# Patient Record
Sex: Female | Born: 1960 | Race: White | Hispanic: No | Marital: Married | Smoking: Never smoker
Health system: Southern US, Community
[De-identification: ages and names within clinical notes are randomized; demographics above are authoritative.]

## PROBLEM LIST (undated history)

## (undated) DIAGNOSIS — E039 Hypothyroidism, unspecified: Secondary | ICD-10-CM

## (undated) DIAGNOSIS — F419 Anxiety disorder, unspecified: Secondary | ICD-10-CM

## (undated) DIAGNOSIS — E785 Hyperlipidemia, unspecified: Secondary | ICD-10-CM

## (undated) DIAGNOSIS — D494 Neoplasm of unspecified behavior of bladder: Secondary | ICD-10-CM

## (undated) DIAGNOSIS — K219 Gastro-esophageal reflux disease without esophagitis: Secondary | ICD-10-CM

## (undated) DIAGNOSIS — I10 Essential (primary) hypertension: Secondary | ICD-10-CM

## (undated) DIAGNOSIS — C801 Malignant (primary) neoplasm, unspecified: Secondary | ICD-10-CM

## (undated) DIAGNOSIS — G43909 Migraine, unspecified, not intractable, without status migrainosus: Secondary | ICD-10-CM

## (undated) DIAGNOSIS — R51 Headache: Secondary | ICD-10-CM

## (undated) HISTORY — PX: TOTAL THYROIDECTOMY: SHX2547

## (undated) HISTORY — PX: COLONOSCOPY: SHX174

## (undated) HISTORY — PX: WISDOM TOOTH EXTRACTION: SHX21

---

## 1998-06-13 ENCOUNTER — Other Ambulatory Visit: Admission: RE | Admit: 1998-06-13 | Discharge: 1998-06-13 | Payer: Self-pay | Admitting: Obstetrics & Gynecology

## 1999-06-25 ENCOUNTER — Other Ambulatory Visit: Admission: RE | Admit: 1999-06-25 | Discharge: 1999-06-25 | Payer: Self-pay | Admitting: Obstetrics and Gynecology

## 2000-03-23 ENCOUNTER — Other Ambulatory Visit: Admission: RE | Admit: 2000-03-23 | Discharge: 2000-03-23 | Payer: Self-pay | Admitting: Obstetrics and Gynecology

## 2000-04-20 ENCOUNTER — Encounter: Payer: Self-pay | Admitting: Obstetrics and Gynecology

## 2000-04-20 ENCOUNTER — Ambulatory Visit (HOSPITAL_COMMUNITY): Admission: RE | Admit: 2000-04-20 | Discharge: 2000-04-20 | Payer: Self-pay | Admitting: Obstetrics and Gynecology

## 2001-04-20 ENCOUNTER — Other Ambulatory Visit: Admission: RE | Admit: 2001-04-20 | Discharge: 2001-04-20 | Payer: Self-pay | Admitting: Obstetrics and Gynecology

## 2003-03-15 ENCOUNTER — Encounter: Payer: Self-pay | Admitting: Obstetrics and Gynecology

## 2003-03-15 ENCOUNTER — Encounter: Admission: RE | Admit: 2003-03-15 | Discharge: 2003-03-15 | Payer: Self-pay | Admitting: Obstetrics and Gynecology

## 2003-05-25 ENCOUNTER — Encounter: Payer: Self-pay | Admitting: Obstetrics and Gynecology

## 2003-05-25 ENCOUNTER — Ambulatory Visit (HOSPITAL_COMMUNITY): Admission: RE | Admit: 2003-05-25 | Discharge: 2003-05-25 | Payer: Self-pay | Admitting: Obstetrics and Gynecology

## 2003-06-06 ENCOUNTER — Encounter (INDEPENDENT_AMBULATORY_CARE_PROVIDER_SITE_OTHER): Payer: Self-pay

## 2003-06-06 ENCOUNTER — Ambulatory Visit (HOSPITAL_COMMUNITY): Admission: RE | Admit: 2003-06-06 | Discharge: 2003-06-06 | Payer: Self-pay | Admitting: Obstetrics and Gynecology

## 2003-06-06 ENCOUNTER — Encounter: Payer: Self-pay | Admitting: Obstetrics and Gynecology

## 2003-06-30 ENCOUNTER — Encounter: Payer: Self-pay | Admitting: Surgery

## 2003-07-06 ENCOUNTER — Encounter (INDEPENDENT_AMBULATORY_CARE_PROVIDER_SITE_OTHER): Payer: Self-pay | Admitting: Specialist

## 2003-07-06 ENCOUNTER — Observation Stay (HOSPITAL_COMMUNITY): Admission: RE | Admit: 2003-07-06 | Discharge: 2003-07-07 | Payer: Self-pay | Admitting: Surgery

## 2003-08-21 ENCOUNTER — Encounter (HOSPITAL_COMMUNITY): Admission: RE | Admit: 2003-08-21 | Discharge: 2003-11-19 | Payer: Self-pay | Admitting: Surgery

## 2003-09-01 ENCOUNTER — Encounter: Payer: Self-pay | Admitting: Surgery

## 2004-04-24 ENCOUNTER — Encounter: Admission: RE | Admit: 2004-04-24 | Discharge: 2004-04-24 | Payer: Self-pay | Admitting: Endocrinology

## 2004-09-23 ENCOUNTER — Encounter (HOSPITAL_COMMUNITY): Admission: RE | Admit: 2004-09-23 | Discharge: 2004-12-22 | Payer: Self-pay | Admitting: Endocrinology

## 2004-10-11 ENCOUNTER — Encounter: Admission: RE | Admit: 2004-10-11 | Discharge: 2004-10-11 | Payer: Self-pay | Admitting: Obstetrics and Gynecology

## 2005-09-29 ENCOUNTER — Encounter (HOSPITAL_COMMUNITY): Admission: RE | Admit: 2005-09-29 | Discharge: 2005-12-28 | Payer: Self-pay | Admitting: Endocrinology

## 2005-10-24 ENCOUNTER — Encounter: Admission: RE | Admit: 2005-10-24 | Discharge: 2005-10-24 | Payer: Self-pay | Admitting: Obstetrics and Gynecology

## 2006-03-09 ENCOUNTER — Encounter: Admission: RE | Admit: 2006-03-09 | Discharge: 2006-03-09 | Payer: Self-pay | Admitting: Endocrinology

## 2006-10-12 ENCOUNTER — Encounter (HOSPITAL_COMMUNITY): Admission: RE | Admit: 2006-10-12 | Discharge: 2006-10-16 | Payer: Self-pay | Admitting: Endocrinology

## 2006-11-25 ENCOUNTER — Encounter: Admission: RE | Admit: 2006-11-25 | Discharge: 2006-11-25 | Payer: Self-pay | Admitting: Obstetrics and Gynecology

## 2007-12-06 ENCOUNTER — Encounter: Admission: RE | Admit: 2007-12-06 | Discharge: 2007-12-06 | Payer: Self-pay | Admitting: Obstetrics and Gynecology

## 2008-03-31 ENCOUNTER — Encounter: Admission: RE | Admit: 2008-03-31 | Discharge: 2008-03-31 | Payer: Self-pay | Admitting: Endocrinology

## 2008-07-18 ENCOUNTER — Ambulatory Visit (HOSPITAL_COMMUNITY): Admission: RE | Admit: 2008-07-18 | Discharge: 2008-07-18 | Payer: Self-pay | Admitting: Internal Medicine

## 2008-07-24 ENCOUNTER — Encounter (HOSPITAL_COMMUNITY): Admission: RE | Admit: 2008-07-24 | Discharge: 2008-10-04 | Payer: Self-pay | Admitting: Internal Medicine

## 2009-01-12 ENCOUNTER — Encounter: Admission: RE | Admit: 2009-01-12 | Discharge: 2009-01-12 | Payer: Self-pay | Admitting: Obstetrics and Gynecology

## 2009-12-10 ENCOUNTER — Encounter: Admission: RE | Admit: 2009-12-10 | Discharge: 2009-12-10 | Payer: Self-pay | Admitting: Internal Medicine

## 2010-02-14 ENCOUNTER — Encounter: Admission: RE | Admit: 2010-02-14 | Discharge: 2010-02-14 | Payer: Self-pay | Admitting: Obstetrics and Gynecology

## 2010-12-22 ENCOUNTER — Encounter: Payer: Self-pay | Admitting: Endocrinology

## 2010-12-22 ENCOUNTER — Encounter: Payer: Self-pay | Admitting: Obstetrics and Gynecology

## 2010-12-23 ENCOUNTER — Encounter: Payer: Self-pay | Admitting: Internal Medicine

## 2011-01-14 ENCOUNTER — Other Ambulatory Visit: Payer: Self-pay | Admitting: Internal Medicine

## 2011-01-14 DIAGNOSIS — C73 Malignant neoplasm of thyroid gland: Secondary | ICD-10-CM

## 2011-01-24 ENCOUNTER — Ambulatory Visit
Admission: RE | Admit: 2011-01-24 | Discharge: 2011-01-24 | Disposition: A | Discharge status: Home / Self Care | Payer: BC Managed Care – PPO | Source: Ambulatory Visit | Attending: Internal Medicine | Admitting: Internal Medicine

## 2011-01-24 DIAGNOSIS — C73 Malignant neoplasm of thyroid gland: Secondary | ICD-10-CM

## 2011-03-24 ENCOUNTER — Other Ambulatory Visit: Payer: Self-pay | Admitting: Obstetrics and Gynecology

## 2011-03-24 DIAGNOSIS — Z1231 Encounter for screening mammogram for malignant neoplasm of breast: Secondary | ICD-10-CM

## 2011-04-02 ENCOUNTER — Ambulatory Visit
Admission: RE | Admit: 2011-04-02 | Discharge: 2011-04-02 | Disposition: A | Discharge status: Home / Self Care | Payer: BC Managed Care – PPO | Source: Ambulatory Visit | Attending: Obstetrics and Gynecology | Admitting: Obstetrics and Gynecology

## 2011-04-02 DIAGNOSIS — Z1231 Encounter for screening mammogram for malignant neoplasm of breast: Secondary | ICD-10-CM

## 2011-04-04 ENCOUNTER — Other Ambulatory Visit: Payer: Self-pay | Admitting: Obstetrics and Gynecology

## 2011-04-04 DIAGNOSIS — N644 Mastodynia: Secondary | ICD-10-CM

## 2011-04-18 NOTE — Op Note (Signed)
NAME:  Carla Rojas, Carla Rojas                          ACCOUNT NO.:  [id number]   MEDICAL RECORD NO.:  [id number]                   PATIENT TYPE:  AMB   LOCATION:  DAY                                  FACILITY:  [facility]   PHYSICIAN:  Currie Paris, M.D.           DATE OF BIRTH:  1961-03-28   DATE OF PROCEDURE:  07/06/2003  DATE OF DISCHARGE:                                 OPERATIVE REPORT   Office MR# [id number].   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   POSTOPERATIVE DIAGNOSIS:  Papillary carcinoma, left thyroid lobe.   OPERATION/PROCEDURE:  Total thyroidectomy.   SURGEON:  Currie Paris, M.D.   ASSISTANT:  Abigail Miyamoto, M.D.   ANESTHESIA:  General endotracheal anesthesia.   CLINICAL HISTORY:  This patient is a 50 year old who was noted to have a  left thyroid nodule on physical examination and a scan showed some  calcification and an FNA showed a papillary carcinoma.  She had a TSH  showing her to be euthyroid.  After a lengthy discussion with the patient,  she elected to proceed to left thyroidectomy with probable total  thyroidectomy as there is also a smaller nodule in the right lobe.   DESCRIPTION OF PROCEDURE:  The patient was seen in the holding area, had no  further questions and was ready to proceed to thyroidectomy.   She was taken to the operating room and after satisfactory general  endotracheal anesthesia obtained, the neck was prepped and draped.  We made  a curving incision in a skin crease to divide the skin and subcutaneous  tissue and platysma.  Then elevated the subplatysmal flaps in the usual  fashion.  Self-retaining retractor was placed.  The pretracheal fascia was  opened in the midline.  The muscles were elevated off the left lobe of the  thyroid to expose it.  I divided one small vein coming to the inferior pole  because that was tethering a little bit and then divided the middle thyroid  vein after  sucking that out, identifying it coming into the thyroid.  I then  mobilized the superior pole using some traction and there was a little bit  of muscle stuck here but we were able to nicely dissect that off.  I ligated  the superior pole vessels, dissecting them out and individually ligating  them.   Thyroid then rotated medially.  I was able to dissect into the area of the  TE groove and identify nerve.  I also saw what appeared to be the superior  and anterior parathyroids laying on the undersurface of the thyroid gland  and fed by small vessels coming from the inferior thyroid artery.  The  vessels coming under the thyroid were divided individually using clips.  They stayed well upon the thyroid trying to stay in a nice plane here and  away from the distal on the vessels, so they  were getting the branches and  then allowing the parathyroids and vasculature to retract back laterally  into the neck.  I came around working from the inferior pole up to the  superior pole and paying attention to the nerve, was able to dissect the  thyroid off and loosen it from the trachea, and then divide the attachments  throughout the ligament of Allyson Sabal again, noticing the nerve appeared intact  through this dissection.  I freed up a little more detachments medially of  the isthmus and then at this point had the entire left lobe and isthmus  freed up off the thyroid.  The isthmus was divided and the thyroid gland  sent for frozen section.  This subsequently returned papillary carcinoma of  the thyroid.   Plans had been made already to proceed to total thyroidectomy because of the  presence of the nodule in the right lobe and high suspicion on FNA that this  was a papillary tumor.  I approached the right lobe similar to the left  lobe.  The middle vein was divided first.  I then individually ligated the  vessels to the superior pole staying right as they entered the superior  pole.  Then with retraction  of the gland medially, again able to identify  the nerve as it entered the larynx.  Once that was identified, I freed up  the inferior pole some more and then again began developing and dividing  small branches of the artery coming out  of the thyroid, staying up on the  thyroid vein and doing this individually.  Again we saw what appeared to be  parathyroid gland although these could represent small lymph nodes.  I felt  by their location and by their vessels coming off, that they were  parathyroid glands.  Again we stayed right on the cervical plane of the  thyroid and finally ligated and divided the small attachments at the  ligament of Berry.  Both sides were irrigated and checked carefully for  hemostasis at the completion of each side and then at the completion of the  case.  Everything appeared to be dry.   The incision was closed using some 4-0 Vicryl to approximate the straps, 4-0  Vicryl to close the platysma and staples and Steri-Strips on the skin.   The patient tolerated the procedure well.  There were no operative  complications.  All counts were correct.                                               Currie Paris, M.D.    CJS/MEDQ  D:  07/06/2003  T:  07/06/2003  Job:  [id number]   cc:   Donia Guiles, M.D.  [address] [place]  [place] [address]  Fax: [phone]   Malachi Pro. Ambrose Mantle, M.D.  [address]  [address]  [place]  [place] [address]  Fax: [phone]

## 2011-05-06 ENCOUNTER — Ambulatory Visit
Admission: RE | Admit: 2011-05-06 | Discharge: 2011-05-06 | Disposition: A | Discharge status: Home / Self Care | Payer: BC Managed Care – PPO | Source: Ambulatory Visit | Attending: Obstetrics and Gynecology | Admitting: Obstetrics and Gynecology

## 2011-05-06 DIAGNOSIS — N644 Mastodynia: Secondary | ICD-10-CM

## 2012-01-12 ENCOUNTER — Other Ambulatory Visit: Payer: Self-pay | Admitting: Internal Medicine

## 2012-01-12 DIAGNOSIS — C73 Malignant neoplasm of thyroid gland: Secondary | ICD-10-CM

## 2012-01-21 ENCOUNTER — Ambulatory Visit
Admission: RE | Admit: 2012-01-21 | Discharge: 2012-01-21 | Disposition: A | Discharge status: Home / Self Care | Payer: BC Managed Care – PPO | Source: Ambulatory Visit | Attending: Internal Medicine | Admitting: Internal Medicine

## 2012-01-21 DIAGNOSIS — C73 Malignant neoplasm of thyroid gland: Secondary | ICD-10-CM

## 2012-06-21 ENCOUNTER — Other Ambulatory Visit: Payer: Self-pay | Admitting: Obstetrics and Gynecology

## 2012-06-21 DIAGNOSIS — Z1231 Encounter for screening mammogram for malignant neoplasm of breast: Secondary | ICD-10-CM

## 2012-07-06 ENCOUNTER — Ambulatory Visit: Payer: BC Managed Care – PPO

## 2012-07-27 ENCOUNTER — Ambulatory Visit
Admission: RE | Admit: 2012-07-27 | Discharge: 2012-07-27 | Disposition: A | Discharge status: Home / Self Care | Payer: BC Managed Care – PPO | Source: Ambulatory Visit | Attending: Obstetrics and Gynecology | Admitting: Obstetrics and Gynecology

## 2012-07-27 DIAGNOSIS — Z1231 Encounter for screening mammogram for malignant neoplasm of breast: Secondary | ICD-10-CM

## 2013-03-22 ENCOUNTER — Other Ambulatory Visit: Payer: Self-pay | Admitting: Internal Medicine

## 2013-03-22 DIAGNOSIS — C73 Malignant neoplasm of thyroid gland: Secondary | ICD-10-CM

## 2013-04-04 ENCOUNTER — Ambulatory Visit
Admission: RE | Admit: 2013-04-04 | Discharge: 2013-04-04 | Disposition: A | Discharge status: Home / Self Care | Payer: BC Managed Care – PPO | Source: Ambulatory Visit | Attending: Internal Medicine | Admitting: Internal Medicine

## 2013-04-04 DIAGNOSIS — C73 Malignant neoplasm of thyroid gland: Secondary | ICD-10-CM

## 2013-08-03 ENCOUNTER — Other Ambulatory Visit: Payer: Self-pay

## 2013-08-03 DIAGNOSIS — Z1231 Encounter for screening mammogram for malignant neoplasm of breast: Secondary | ICD-10-CM

## 2013-08-19 ENCOUNTER — Ambulatory Visit
Admission: RE | Admit: 2013-08-19 | Discharge: 2013-08-19 | Disposition: A | Discharge status: Home / Self Care | Payer: BC Managed Care – PPO | Source: Ambulatory Visit

## 2013-08-19 DIAGNOSIS — Z1231 Encounter for screening mammogram for malignant neoplasm of breast: Secondary | ICD-10-CM

## 2013-08-23 ENCOUNTER — Other Ambulatory Visit: Payer: Self-pay | Admitting: Obstetrics and Gynecology

## 2013-08-23 DIAGNOSIS — R928 Other abnormal and inconclusive findings on diagnostic imaging of breast: Secondary | ICD-10-CM

## 2013-08-26 ENCOUNTER — Ambulatory Visit
Admission: RE | Admit: 2013-08-26 | Discharge: 2013-08-26 | Disposition: A | Discharge status: Home / Self Care | Payer: BC Managed Care – PPO | Source: Ambulatory Visit | Attending: Obstetrics and Gynecology | Admitting: Obstetrics and Gynecology

## 2013-08-26 DIAGNOSIS — R928 Other abnormal and inconclusive findings on diagnostic imaging of breast: Secondary | ICD-10-CM

## 2014-09-04 ENCOUNTER — Other Ambulatory Visit: Payer: Self-pay | Admitting: Gastroenterology

## 2014-09-21 ENCOUNTER — Other Ambulatory Visit: Payer: Self-pay

## 2014-09-21 DIAGNOSIS — Z1231 Encounter for screening mammogram for malignant neoplasm of breast: Secondary | ICD-10-CM

## 2014-10-03 ENCOUNTER — Ambulatory Visit
Admission: RE | Admit: 2014-10-03 | Discharge: 2014-10-03 | Disposition: A | Discharge status: Home / Self Care | Payer: BC Managed Care – PPO | Source: Ambulatory Visit

## 2014-10-03 DIAGNOSIS — Z1231 Encounter for screening mammogram for malignant neoplasm of breast: Secondary | ICD-10-CM

## 2015-07-02 ENCOUNTER — Other Ambulatory Visit (HOSPITAL_COMMUNITY): Payer: Self-pay | Admitting: Otolaryngology

## 2015-07-02 DIAGNOSIS — R1310 Dysphagia, unspecified: Secondary | ICD-10-CM

## 2015-07-03 ENCOUNTER — Other Ambulatory Visit (HOSPITAL_COMMUNITY): Payer: Self-pay | Admitting: Otolaryngology

## 2015-07-03 DIAGNOSIS — R1314 Dysphagia, pharyngoesophageal phase: Secondary | ICD-10-CM

## 2015-07-04 ENCOUNTER — Ambulatory Visit (HOSPITAL_COMMUNITY): Admission: RE | Admit: 2015-07-04 | Payer: BC Managed Care – PPO | Source: Ambulatory Visit

## 2015-07-04 ENCOUNTER — Ambulatory Visit (HOSPITAL_COMMUNITY): Payer: BC Managed Care – PPO

## 2015-07-06 ENCOUNTER — Ambulatory Visit (HOSPITAL_COMMUNITY)
Admission: RE | Admit: 2015-07-06 | Discharge: 2015-07-06 | Disposition: A | Discharge status: Home / Self Care | Payer: BC Managed Care – PPO | Source: Ambulatory Visit | Attending: Otolaryngology | Admitting: Otolaryngology

## 2015-07-06 ENCOUNTER — Other Ambulatory Visit (HOSPITAL_COMMUNITY): Payer: Self-pay | Admitting: Otolaryngology

## 2015-07-06 DIAGNOSIS — R05 Cough: Secondary | ICD-10-CM | POA: Diagnosis not present

## 2015-07-06 DIAGNOSIS — E890 Postprocedural hypothyroidism: Secondary | ICD-10-CM | POA: Insufficient documentation

## 2015-07-06 DIAGNOSIS — Z85850 Personal history of malignant neoplasm of thyroid: Secondary | ICD-10-CM | POA: Insufficient documentation

## 2015-07-06 DIAGNOSIS — R1314 Dysphagia, pharyngoesophageal phase: Secondary | ICD-10-CM

## 2015-07-06 DIAGNOSIS — R1310 Dysphagia, unspecified: Secondary | ICD-10-CM

## 2015-07-06 DIAGNOSIS — R4702 Dysphasia: Secondary | ICD-10-CM

## 2015-07-06 DIAGNOSIS — K449 Diaphragmatic hernia without obstruction or gangrene: Secondary | ICD-10-CM | POA: Insufficient documentation

## 2015-07-06 DIAGNOSIS — K210 Gastro-esophageal reflux disease with esophagitis: Secondary | ICD-10-CM | POA: Insufficient documentation

## 2015-07-06 DIAGNOSIS — R066 Hiccough: Secondary | ICD-10-CM | POA: Diagnosis not present

## 2015-07-06 DIAGNOSIS — K224 Dyskinesia of esophagus: Secondary | ICD-10-CM | POA: Insufficient documentation

## 2015-08-02 ENCOUNTER — Ambulatory Visit: Payer: BC Managed Care – PPO | Admitting: Internal Medicine

## 2015-10-10 ENCOUNTER — Other Ambulatory Visit: Payer: Self-pay

## 2015-10-10 DIAGNOSIS — Z1231 Encounter for screening mammogram for malignant neoplasm of breast: Secondary | ICD-10-CM

## 2015-11-12 ENCOUNTER — Ambulatory Visit
Admission: RE | Admit: 2015-11-12 | Discharge: 2015-11-12 | Disposition: A | Discharge status: Home / Self Care | Payer: BC Managed Care – PPO | Source: Ambulatory Visit

## 2015-11-12 DIAGNOSIS — Z1231 Encounter for screening mammogram for malignant neoplasm of breast: Secondary | ICD-10-CM

## 2016-04-11 ENCOUNTER — Other Ambulatory Visit: Payer: Self-pay | Admitting: Internal Medicine

## 2016-04-11 DIAGNOSIS — C73 Malignant neoplasm of thyroid gland: Secondary | ICD-10-CM

## 2016-04-17 ENCOUNTER — Ambulatory Visit
Admission: RE | Admit: 2016-04-17 | Discharge: 2016-04-17 | Disposition: A | Discharge status: Home / Self Care | Payer: BC Managed Care – PPO | Source: Ambulatory Visit | Attending: Internal Medicine | Admitting: Internal Medicine

## 2016-04-17 DIAGNOSIS — C73 Malignant neoplasm of thyroid gland: Secondary | ICD-10-CM

## 2016-10-09 ENCOUNTER — Other Ambulatory Visit: Payer: Self-pay | Admitting: Family Medicine

## 2016-10-09 DIAGNOSIS — Z1231 Encounter for screening mammogram for malignant neoplasm of breast: Secondary | ICD-10-CM

## 2016-11-12 ENCOUNTER — Ambulatory Visit
Admission: RE | Admit: 2016-11-12 | Discharge: 2016-11-12 | Disposition: A | Discharge status: Home / Self Care | Payer: BC Managed Care – PPO | Source: Ambulatory Visit | Attending: Family Medicine | Admitting: Family Medicine

## 2016-11-12 DIAGNOSIS — Z1231 Encounter for screening mammogram for malignant neoplasm of breast: Secondary | ICD-10-CM

## 2017-10-05 ENCOUNTER — Other Ambulatory Visit: Payer: Self-pay | Admitting: Obstetrics and Gynecology

## 2017-10-07 ENCOUNTER — Other Ambulatory Visit: Payer: Self-pay | Admitting: Obstetrics and Gynecology

## 2017-10-07 DIAGNOSIS — Z1231 Encounter for screening mammogram for malignant neoplasm of breast: Secondary | ICD-10-CM

## 2017-10-23 NOTE — H&P (Signed)
Carla Rojas, Carla Rojas NO.:  [id number]  MEDICAL RECORD NO.:  [id number]  LOCATION:                                 FACILITY:  PHYSICIAN:  Lucille Passy. Ulanda Edison, M.D.      DATE OF BIRTH:  DATE OF ADMISSION: DATE OF DISCHARGE:                             HISTORY & PHYSICAL   PRESENT ILLNESS:  This is a 56 year old white female, para 0, who is admitted for D and C, hysteroscopy, and probable removal of polyp because of postmenopausal bleeding.  The patient was seen on September 07, 2017, and reported a spot of bleeding from her vagina in the past month. We chose to proceed with ultrasound.  The ultrasound showed an endometrial thickness of 4.6 mm that suggested an endometrial polyp 6 x 2 x 6 mm.  Patient was counseled about the presence of a presumed polyp, even though it was small, and we chose to proceed with doing a D and C and if the polyp is present, remove the polyp.  Since that time, the patient has had an additional spot of bleeding from her vagina.  PAST MEDICAL HISTORY:  Reveals no known allergies.  Operation:  She has had oral surgery x3, colonoscopy in 2015.  Medical history:  The patient is hypothyroid.  She also has high lipids. She has some depression.  FAMILY HISTORY:  Mother with good health.  Paternal grandfather with diabetes and heart disease.  Maternal grandfather with diabetes, heart disease, and high blood pressure.  The patient has had normal Pap smears.  She does not drink or smoke.  She has worked at [employer] for 35 years.  PHYSICAL EXAMINATION:  GENERAL:  Well-developed, somewhat overweight white female, in no distress.  5 feet 6 inches, 184 pounds, BMI 29.7. Blood pressure 124/82, pulse of 72. HEAD, EYES, NOSE, AND THROAT:  Normal.  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  Throat is normal. NECK:  Supple, without thyromegaly. HEART:  Normal size and sounds.  No murmurs. LUNGS:  Clear to auscultation. BREASTS:  Soft,  nontender.  No masses palpable. ABDOMEN:  Soft and nontender.  No masses. PELVIC:  Vulva and vagina clean, cervix clean, uterus normal size, adnexa clear.  ADMITTING IMPRESSION:  Postmenopausal bleeding, probable small endometrial polyp.  The patient is admitted to have D and C and if a polyp is present, remove the polyp.  She understands that there are some risks involved including the risk of anesthesia, risk of perforating the uterus, risk of fluid overload, injury to surrounding organs, she is ready to proceed.     Lucille Passy. Ulanda Edison, M.D.   ______________________________ Lucille Passy. Ulanda Edison, M.D.    TFH/MEDQ  D:  10/22/2017  T:  10/22/2017  Job:  [id number]

## 2017-10-26 NOTE — Patient Instructions (Addendum)
Your procedure is scheduled on:  FRIDAY, NOV 30  Enter through the [place] [facility] at:  6 AM  Pick up the phone at the desk and dial [phone].  Call this number if you have problems the morning of surgery: [phone].  Remember: Do NOT eat food OR drink clear liquids (including water) after Midnight Thursday.  Take these medicines the morning of surgery with a SIP OF WATER:  Atenolol, synthroid, liothyronine  Bring inhaler with you on day of surgery.  Do Not smoke on the day of surgery.  Stop herbal medications and supplements at this time.  Do NOT wear jewelry (body piercing), metal hair clips/bobby pins, make-up, or nail polish. Do NOT wear lotions, powders, or perfumes.  You may wear deoderant. Do NOT shave for 48 hours prior to surgery. Do NOT bring valuables to the hospital. Contacts, dentures, or bridgework may not be worn into surgery.  Leave suitcase in car.  After surgery it may be brought to your room.  For patients admitted to the hospital, checkout time is 11:00 AM the day of discharge. Have a responsible adult drive you home and stay with you for 24 hours after your procedure.

## 2017-10-28 ENCOUNTER — Encounter (HOSPITAL_COMMUNITY): Payer: Self-pay | Admitting: *Deleted

## 2017-10-28 ENCOUNTER — Encounter (HOSPITAL_COMMUNITY)
Admission: RE | Admit: 2017-10-28 | Discharge: 2017-10-28 | Disposition: A | Discharge status: Home / Self Care | Payer: BC Managed Care – PPO | Source: Ambulatory Visit | Attending: Obstetrics and Gynecology | Admitting: Obstetrics and Gynecology

## 2017-10-28 ENCOUNTER — Other Ambulatory Visit: Payer: Self-pay

## 2017-10-28 DIAGNOSIS — R001 Bradycardia, unspecified: Secondary | ICD-10-CM | POA: Diagnosis not present

## 2017-10-28 DIAGNOSIS — E039 Hypothyroidism, unspecified: Secondary | ICD-10-CM | POA: Diagnosis not present

## 2017-10-28 DIAGNOSIS — N950 Postmenopausal bleeding: Secondary | ICD-10-CM | POA: Diagnosis present

## 2017-10-28 DIAGNOSIS — E669 Obesity, unspecified: Secondary | ICD-10-CM | POA: Diagnosis not present

## 2017-10-28 DIAGNOSIS — F329 Major depressive disorder, single episode, unspecified: Secondary | ICD-10-CM | POA: Diagnosis not present

## 2017-10-28 DIAGNOSIS — I10 Essential (primary) hypertension: Secondary | ICD-10-CM | POA: Diagnosis not present

## 2017-10-28 DIAGNOSIS — Z6831 Body mass index (BMI) 31.0-31.9, adult: Secondary | ICD-10-CM | POA: Diagnosis not present

## 2017-10-28 DIAGNOSIS — Z79899 Other long term (current) drug therapy: Secondary | ICD-10-CM | POA: Diagnosis not present

## 2017-10-28 DIAGNOSIS — N840 Polyp of corpus uteri: Secondary | ICD-10-CM | POA: Diagnosis not present

## 2017-10-28 LAB — BASIC METABOLIC PANEL
Anion gap: 7 (ref 5–15)
BUN: 14 mg/dL (ref 6–20)
CALCIUM: 8.9 mg/dL (ref 8.9–10.3)
CO2: 29 mmol/L (ref 22–32)
CREATININE: 0.75 mg/dL (ref 0.44–1.00)
Chloride: 101 mmol/L (ref 101–111)
GFR calc Af Amer: >60 mL/min (ref >60)
GLUCOSE: 97 mg/dL (ref 65–99)
Potassium: 3.8 mmol/L (ref 3.5–5.1)
Sodium: 137 mmol/L (ref 135–145)

## 2017-10-28 LAB — CBC
HEMATOCRIT: 42.8 % (ref 36.0–46.0)
Hemoglobin: 13.9 g/dL (ref 12.0–15.0)
MCH: 29 pg (ref 26.0–34.0)
MCHC: 32.5 g/dL (ref 30.0–36.0)
MCV: 89.2 fL (ref 78.0–100.0)
PLATELETS: 281 10*3/uL (ref 150–400)
RBC: 4.8 MIL/uL (ref 3.87–5.11)
RDW: 13.1 % (ref 11.5–15.5)
WBC: 5.6 10*3/uL (ref 4.0–10.5)

## 2017-10-28 NOTE — Patient Instructions (Addendum)
Your procedure is scheduled on:  Friday, Nov 30  Enter through the Main Entrance of [facility] at: 6 am  Pick up the phone at the desk and dial [phone].  Call this number if you have problems the morning of surgery: [phone].  Remember: Do NOT eat food or drink clear liquids (including water) after Midnight Thursday  Take these medicines the morning of surgery with a SIP OF WATER: atenolol, synthroid, cytomel  Stop herbal medications and supplements at this time.  Do NOT wear jewelry (body piercing), metal hair clips/bobby pins, make-up, or nail polish. Do NOT wear lotions, powders, or perfumes.  You may wear deoderant. Do NOT shave for 48 hours prior to surgery. Do NOT bring valuables to the hospital.  Have a responsible adult drive you home and stay with you for 24 hours after your procedure.  Home with husband Carla Rojas cell [phone].

## 2017-10-30 ENCOUNTER — Ambulatory Visit (HOSPITAL_COMMUNITY)
Admission: AD | Admit: 2017-10-30 | Discharge: 2017-10-30 | Disposition: A | Discharge status: Home / Self Care | Payer: BC Managed Care – PPO | Source: Ambulatory Visit | Attending: Obstetrics and Gynecology | Admitting: Obstetrics and Gynecology

## 2017-10-30 ENCOUNTER — Encounter (HOSPITAL_COMMUNITY)
Admission: AD | Disposition: A | Discharge status: Home / Self Care | Payer: Self-pay | Source: Ambulatory Visit | Attending: Obstetrics and Gynecology

## 2017-10-30 ENCOUNTER — Encounter (HOSPITAL_COMMUNITY): Payer: Self-pay | Admitting: Emergency Medicine

## 2017-10-30 ENCOUNTER — Ambulatory Visit (HOSPITAL_COMMUNITY): Payer: BC Managed Care – PPO | Admitting: Certified Registered Nurse Anesthetist

## 2017-10-30 DIAGNOSIS — E669 Obesity, unspecified: Secondary | ICD-10-CM | POA: Insufficient documentation

## 2017-10-30 DIAGNOSIS — Z79899 Other long term (current) drug therapy: Secondary | ICD-10-CM | POA: Insufficient documentation

## 2017-10-30 DIAGNOSIS — N950 Postmenopausal bleeding: Secondary | ICD-10-CM | POA: Diagnosis not present

## 2017-10-30 DIAGNOSIS — Z6831 Body mass index (BMI) 31.0-31.9, adult: Secondary | ICD-10-CM | POA: Insufficient documentation

## 2017-10-30 DIAGNOSIS — N840 Polyp of corpus uteri: Secondary | ICD-10-CM | POA: Insufficient documentation

## 2017-10-30 DIAGNOSIS — I10 Essential (primary) hypertension: Secondary | ICD-10-CM | POA: Insufficient documentation

## 2017-10-30 DIAGNOSIS — R001 Bradycardia, unspecified: Secondary | ICD-10-CM | POA: Insufficient documentation

## 2017-10-30 DIAGNOSIS — E039 Hypothyroidism, unspecified: Secondary | ICD-10-CM | POA: Insufficient documentation

## 2017-10-30 DIAGNOSIS — F329 Major depressive disorder, single episode, unspecified: Secondary | ICD-10-CM | POA: Insufficient documentation

## 2017-10-30 HISTORY — DX: Headache: R51

## 2017-10-30 HISTORY — DX: Malignant (primary) neoplasm, unspecified: C80.1

## 2017-10-30 HISTORY — DX: Hypothyroidism, unspecified: E03.9

## 2017-10-30 HISTORY — PX: HYSTEROSCOPY WITH D & C: SHX1775

## 2017-10-30 HISTORY — DX: Hyperlipidemia, unspecified: E78.5

## 2017-10-30 HISTORY — DX: Anxiety disorder, unspecified: F41.9

## 2017-10-30 HISTORY — DX: Essential (primary) hypertension: I10

## 2017-10-30 SURGERY — DILATATION AND CURETTAGE /HYSTEROSCOPY
Anesthesia: General | Laterality: Bilateral

## 2017-10-30 SURGERY — DILATATION AND CURETTAGE /HYSTEROSCOPY
Anesthesia: General | Site: Vagina

## 2017-10-30 MED ORDER — OXYCODONE HCL 5 MG PO TABS
5.0000 mg | ORAL_TABLET | Freq: Once | ORAL | Status: DC | PRN
Start: 2017-10-30 — End: 2017-10-30

## 2017-10-30 MED ORDER — LACTATED RINGERS IV SOLN
INTRAVENOUS | Status: DC
  Administered 2017-10-30: 06:00:00 via INTRAVENOUS

## 2017-10-30 MED ORDER — FENTANYL CITRATE (PF) 100 MCG/2ML IJ SOLN
INTRAMUSCULAR | Status: AC
  Filled 2017-10-30: qty 2

## 2017-10-30 MED ORDER — FENTANYL CITRATE (PF) 100 MCG/2ML IJ SOLN
INTRAMUSCULAR | Status: DC | PRN
  Administered 2017-10-30: 50 ug via INTRAVENOUS

## 2017-10-30 MED ORDER — SCOPOLAMINE 1 MG/3DAYS TD PT72
1.0000 | MEDICATED_PATCH | Freq: Once | TRANSDERMAL | Status: DC
  Administered 2017-10-30: 1.5 mg via TRANSDERMAL

## 2017-10-30 MED ORDER — LIDOCAINE HCL (CARDIAC) 20 MG/ML IV SOLN
INTRAVENOUS | Status: AC
  Filled 2017-10-30: qty 5

## 2017-10-30 MED ORDER — SODIUM CHLORIDE 0.9 % IR SOLN
Status: DC | PRN
  Administered 2017-10-30: 3000 mL

## 2017-10-30 MED ORDER — ATROPINE SULFATE 0.4 MG/ML IJ SOLN
INTRAMUSCULAR | Status: DC | PRN
  Administered 2017-10-30: 0.2 mg via INTRAVENOUS

## 2017-10-30 MED ORDER — DEXAMETHASONE SODIUM PHOSPHATE 10 MG/ML IJ SOLN
INTRAMUSCULAR | Status: DC | PRN
  Administered 2017-10-30: 10 mg via INTRAVENOUS

## 2017-10-30 MED ORDER — LIDOCAINE HCL (CARDIAC) 20 MG/ML IV SOLN
INTRAVENOUS | Status: DC | PRN
  Administered 2017-10-30: 60 mg via INTRAVENOUS

## 2017-10-30 MED ORDER — IBUPROFEN 200 MG PO TABS: 600.0000 mg | ORAL_TABLET | Freq: Four times a day (QID) | ORAL | 0 refills | Status: AC | PRN

## 2017-10-30 MED ORDER — OXYCODONE HCL 5 MG/5ML PO SOLN
5.0000 mg | Freq: Once | ORAL | Status: DC | PRN
Start: 2017-10-30 — End: 2017-10-30

## 2017-10-30 MED ORDER — EPHEDRINE SULFATE 50 MG/ML IJ SOLN
INTRAMUSCULAR | Status: DC | PRN
  Administered 2017-10-30: 15 mg via INTRAVENOUS

## 2017-10-30 MED ORDER — KETOROLAC TROMETHAMINE 30 MG/ML IJ SOLN
INTRAMUSCULAR | Status: DC | PRN
  Administered 2017-10-30: 30 mg via INTRAVENOUS

## 2017-10-30 MED ORDER — ONDANSETRON HCL 4 MG/2ML IJ SOLN: 4.0000 mg | Freq: Four times a day (QID) | INTRAMUSCULAR | Status: DC | PRN

## 2017-10-30 MED ORDER — EPHEDRINE 5 MG/ML INJ
INTRAVENOUS | Status: AC
  Filled 2017-10-30: qty 10

## 2017-10-30 MED ORDER — ONDANSETRON HCL 4 MG/2ML IJ SOLN
INTRAMUSCULAR | Status: DC | PRN
  Administered 2017-10-30: 4 mg via INTRAVENOUS

## 2017-10-30 MED ORDER — GLYCOPYRROLATE 0.2 MG/ML IJ SOLN
INTRAMUSCULAR | Status: AC
  Filled 2017-10-30: qty 1

## 2017-10-30 MED ORDER — DEXAMETHASONE SODIUM PHOSPHATE 10 MG/ML IJ SOLN
INTRAMUSCULAR | Status: AC
  Filled 2017-10-30: qty 1

## 2017-10-30 MED ORDER — PROPOFOL 10 MG/ML IV BOLUS
INTRAVENOUS | Status: DC | PRN
  Administered 2017-10-30: 200 mg via INTRAVENOUS

## 2017-10-30 MED ORDER — MIDAZOLAM HCL 2 MG/2ML IJ SOLN
INTRAMUSCULAR | Status: AC
  Filled 2017-10-30: qty 2

## 2017-10-30 MED ORDER — MIDAZOLAM HCL 2 MG/2ML IJ SOLN
INTRAMUSCULAR | Status: DC | PRN
  Administered 2017-10-30: 2 mg via INTRAVENOUS

## 2017-10-30 MED ORDER — KETOROLAC TROMETHAMINE 30 MG/ML IJ SOLN
INTRAMUSCULAR | Status: AC
  Filled 2017-10-30: qty 1

## 2017-10-30 MED ORDER — SCOPOLAMINE 1 MG/3DAYS TD PT72
MEDICATED_PATCH | TRANSDERMAL | Status: AC
  Administered 2017-10-30: 1.5 mg via TRANSDERMAL
  Filled 2017-10-30: qty 1

## 2017-10-30 MED ORDER — GLYCOPYRROLATE 0.2 MG/ML IJ SOLN
INTRAMUSCULAR | Status: DC | PRN
  Administered 2017-10-30: 0.2 mg via INTRAVENOUS

## 2017-10-30 MED ORDER — FENTANYL CITRATE (PF) 100 MCG/2ML IJ SOLN: 25.0000 ug | INTRAMUSCULAR | Status: DC | PRN

## 2017-10-30 MED ORDER — DEXAMETHASONE SODIUM PHOSPHATE 4 MG/ML IJ SOLN
INTRAMUSCULAR | Status: AC
  Filled 2017-10-30: qty 1

## 2017-10-30 MED ORDER — ATROPINE SULFATE 0.4 MG/ML IJ SOLN
INTRAMUSCULAR | Status: AC
  Filled 2017-10-30: qty 1

## 2017-10-30 MED ORDER — PROPOFOL 10 MG/ML IV BOLUS
INTRAVENOUS | Status: AC
  Filled 2017-10-30: qty 20

## 2017-10-30 MED ORDER — ONDANSETRON HCL 4 MG/2ML IJ SOLN
INTRAMUSCULAR | Status: AC
  Filled 2017-10-30: qty 2

## 2017-10-30 SURGICAL SUPPLY — 14 items
CANISTER SUCT 3000ML PPV (MISCELLANEOUS) ×2 IMPLANT
CATH ROBINSON RED A/P 16FR (CATHETERS) ×2 IMPLANT
CONTAINER PREFILL 10% NBF 60ML (FORM) ×4 IMPLANT
ELECT REM PT RETURN 9FT ADLT (ELECTROSURGICAL)
ELECTRODE REM PT RTRN 9FT ADLT (ELECTROSURGICAL) IMPLANT
GLOVE BIO SURGEON STRL SZ7.5 (GLOVE) ×2 IMPLANT
GLOVE BIOGEL PI IND STRL 7.0 (GLOVE) ×1 IMPLANT
GLOVE BIOGEL PI INDICATOR 7.0 (GLOVE) ×1
GOWN STRL REUS W/TWL LRG LVL3 (GOWN DISPOSABLE) ×4 IMPLANT
PACK VAGINAL MINOR WOMEN LF (CUSTOM PROCEDURE TRAY) ×2 IMPLANT
PAD OB MATERNITY 4.3X12.25 (PERSONAL CARE ITEMS) ×2 IMPLANT
TOWEL OR 17X24 6PK STRL BLUE (TOWEL DISPOSABLE) ×4 IMPLANT
TUBING AQUILEX INFLOW (TUBING) ×2 IMPLANT
TUBING AQUILEX OUTFLOW (TUBING) ×2 IMPLANT

## 2017-10-30 NOTE — Anesthesia Preprocedure Evaluation (Signed)
Anesthesia Evaluation  Patient identified by MRN, date of birth, ID band Patient awake    Reviewed: Allergy & Precautions, H&P , NPO status , Patient's Chart, lab work & pertinent test results  Airway Mallampati: II   Neck ROM: full    Dental   Pulmonary neg pulmonary ROS,    breath sounds clear to auscultation       Cardiovascular hypertension,  Rhythm:regular Rate:Normal     Neuro/Psych    GI/Hepatic   Endo/Other  Hypothyroidism obese  Renal/GU      Musculoskeletal   Abdominal   Peds  Hematology   Anesthesia Other Findings   Reproductive/Obstetrics                             Anesthesia Physical Anesthesia Plan  ASA: II  Anesthesia Plan: General   Post-op Pain Management:    Induction: Intravenous  PONV Risk Score and Plan: 3 and Ondansetron, Dexamethasone, Midazolam, Scopolamine patch - Pre-op and Treatment may vary due to age or medical condition  Airway Management Planned: LMA  Additional Equipment:   Intra-op Plan:   Post-operative Plan:   Informed Consent: I have reviewed the patients History and Physical, chart, labs and discussed the procedure including the risks, benefits and alternatives for the proposed anesthesia with the patient or authorized representative who has indicated his/her understanding and acceptance.     Plan Discussed with: CRNA, Anesthesiologist and Surgeon  Anesthesia Plan Comments:         Anesthesia Quick Evaluation

## 2017-10-30 NOTE — Anesthesia Procedure Notes (Signed)
Procedure Name: LMA Insertion Date/Time: 10/30/2017 7:27 AM Performed by: Hewitt Blade, CRNA Pre-anesthesia Checklist: Patient identified, Emergency Drugs available, Suction available and Patient being monitored Patient Re-evaluated:Patient Re-evaluated prior to induction Oxygen Delivery Method: Circle system utilized Preoxygenation: Pre-oxygenation with 100% oxygen Induction Type: IV induction LMA Size: 3.0 Number of attempts: 1 Placement Confirmation: positive ETCO2 and breath sounds checked- equal and bilateral Tube secured with: Tape Dental Injury: Teeth and Oropharynx as per pre-operative assessment

## 2017-10-30 NOTE — Op Note (Signed)
NAMESHIRLEYMAE, HAUTH NO.:  [id number]  MEDICAL RECORD NO.:  [id number]  LOCATION:                                 FACILITY:  PHYSICIAN:  Lucille Passy. Ulanda Edison, M.D.      DATE OF BIRTH:  DATE OF PROCEDURE:  10/30/2017 DATE OF DISCHARGE:                              OPERATIVE REPORT   PREOPERATIVE DIAGNOSES: 1. Postmenopausal bleeding. 2. Possible endometrial polyp.  POSTOPERATIVE DIAGNOSES: 1. Postmenopausal bleeding. 2. Possible endometrial polyp.  OPERATIONS PERFORMED: 1. D and C. 2. Hysteroscopy.  OPERATOR:  Lucille Passy. Ulanda Edison, MD.  ANESTHESIA:  General anesthesia.  DESCRIPTION OF SURGERY:  It should be noted that prior to doing any surgery, but after the patient was in lithotomy position and around the time of an exam under anesthesia, she developed a bradycardia going down to 30 beats per minute.  She was given Robinul by the doctor in attendance.  The anesthesiologist was called to the room.  After the heart beat recovered to the 60 level, he okayed proceeding with the procedure.  The vulva, vagina, and perineum had been prepped with Betadine solution prior to the procedure.  The bladder was emptied with a [place] catheter. The area was draped as a sterile field.  I used a weighted speculum posteriorly.  The cervix was visualized with a Sims speculum.  The anterior lip of the cervix was grasped and pulled into the operative field.  A sound would not enter the cervix, but a small dilator, the #13 was admitted.  The uterus sounded to 6-1/2 cm, slightly anteriorly.  It was dilated up to a 23 dilator.  I used the hysteroscope.  I could see some redundant endometrial tissue on the right anterolateral aspect of the uterine cavity.  It was not definitely identified as a polyp.  I used the polyp forceps to try to remove the tissue.  I then used Baby ring forceps.  There was not a lot of tissue removed, but some tissue was removed.  I then did a fractional D  and C, removing very little tissue.  The endometrial cavity was quite atrophic.  I looked again with the hysteroscope.  There were only small fragments of endometrial tissue remaining.  I reached again with the Baby ring forceps, and no additional tissue was obtained.  The procedure was terminated after I placed pressure on the anterior cervix for the bleeding coming from the tenaculum site.  The patient seemed to tolerate the procedure well other than the significant bradycardia.  ESTIMATED BLOOD LOSS:  Blood loss was thought to be about 30 mL.  FLUID DEFICIT:  About less than 200 mL.  COUNT RESULTS:  Sponge counts were correct.  CONDITION:  She was returned to Recovery in satisfactory condition.     Lucille Passy. Ulanda Edison, M.D.     TFH/MEDQ  D:  10/30/2017  T:  10/30/2017  Job:  [id number]

## 2017-10-30 NOTE — Op Note (Signed)
Op note:   Pre op dx PMB possible endometrial polyp Post op Dx Same Tissue on the right anterolateral surface of the endometrial cavity, not definitely a polyp.General anesthesia EBL 30 cc's. Pt had bradycardia to 30 prior to the procedure but close to the time of the EUA. This cleared with robinul To RR in satisfactory condition Sponge counts correct. Deficit < 200 cc's.

## 2017-10-30 NOTE — Transfer of Care (Signed)
Immediate Anesthesia Transfer of Care Note  Patient: Skip Estimable  Procedure(s) Performed: DILATATION AND CURETTAGE /HYSTEROSCOPY, REMOVAL OF POLYP (N/A Vagina )  Patient Location: PACU  Anesthesia Type:General  Level of Consciousness: awake, alert  and oriented  Airway & Oxygen Therapy: Patient Spontanous Breathing and Patient connected to nasal cannula oxygen  Post-op Assessment: Report given to RN, Post -op Vital signs reviewed and stable and Patient moving all extremities  Post vital signs: Reviewed and stable  Last Vitals:  Vitals:   10/28/17 0819 10/30/17 0614  BP: (!) 126/92 (!) 125/93  Pulse: 63 65  Resp: 16 16  Temp:  36.4 C  SpO2: 98% 97%    Last Pain:  Vitals:   10/30/17 0614  TempSrc: Oral      Patients Stated Pain Goal: 4 (15/95/39 6728)  Complications: No apparent anesthesia complications

## 2017-10-30 NOTE — Progress Notes (Signed)
I examined this lady 10-03-17 and she reports no change in her health since that visit

## 2017-10-30 NOTE — Discharge Instructions (Signed)
DISCHARGE INSTRUCTIONS: D&C / Hysteroscopy / removal of tissue from endometrial cavity The following instructions have been prepared to help you care for yourself upon your return home.   **You may begin taking Ibuprofen after 2:04 pm today**  Personal hygiene:  Use sanitary pads for vaginal drainage, not tampons.  Shower the day after your procedure.  NO tub baths, pools or Jacuzzis for 2-3 weeks.  Wipe front to back after using the bathroom.  Activity and limitations:  Do NOT drive or operate any equipment for 24 hours. The effects of anesthesia are still present and drowsiness may result.  Do NOT rest in bed all day.  Walking is encouraged.  Walk up and down stairs slowly.  You may resume your normal activity in one to two days or as indicated by your physician.  Sexual activity: NO intercourse for at least 2 weeks after the procedure, or as indicated by your physician.  Diet: Eat a light meal as desired this evening. You may resume your usual diet tomorrow.  Return to work: You may resume your work activities in one to two days or as indicated by your doctor.  What to expect after your surgery: Expect to have vaginal bleeding/discharge for 2-3 days and spotting for up to 10 days. It is not unusual to have soreness for up to 1-2 weeks. You may have a slight burning sensation when you urinate for the first day. Mild cramps may continue for a couple of days. You may have a regular period in 2-6 weeks.  Call your doctor for any of the following:  Excessive vaginal bleeding, saturating and changing one pad every hour.  Inability to urinate 6 hours after discharge from hospital.  Pain not relieved by pain medication.  Fever of 100.4 F or greater.  Unusual vaginal discharge or odor.   Patients signature: ______________________  Nurses signature ________________________  Support person's signature_______________________

## 2017-10-31 NOTE — Anesthesia Postprocedure Evaluation (Signed)
Anesthesia Post Note  Patient: Carla Rojas  Procedure(s) Performed: DILATATION AND CURETTAGE /HYSTEROSCOPY, REMOVAL OF POLYP (N/A Vagina )     Patient location during evaluation: PACU Anesthesia Type: General Level of consciousness: awake and alert Pain management: pain level controlled Vital Signs Assessment: post-procedure vital signs reviewed and stable Respiratory status: spontaneous breathing, nonlabored ventilation, respiratory function stable and patient connected to nasal cannula oxygen Cardiovascular status: blood pressure returned to baseline and stable Postop Assessment: no apparent nausea or vomiting Anesthetic complications: no    Last Vitals:  Vitals:   10/30/17 0930 10/30/17 0945  BP:  134/85  Pulse: 72 71  Resp: 16 16  Temp:  (!) 36.4 C  SpO2: 94% 97%    Last Pain:  Vitals:   10/30/17 0811  TempSrc:   PainSc: 0-No pain   Pain Goal: Patients Stated Pain Goal: 4 (10/30/17 6433)               Nadiya Pieratt S

## 2017-11-01 ENCOUNTER — Encounter (HOSPITAL_COMMUNITY): Payer: Self-pay | Admitting: Obstetrics and Gynecology

## 2017-11-13 ENCOUNTER — Ambulatory Visit
Admission: RE | Admit: 2017-11-13 | Discharge: 2017-11-13 | Disposition: A | Discharge status: Home / Self Care | Payer: BC Managed Care – PPO | Source: Ambulatory Visit | Attending: Obstetrics and Gynecology | Admitting: Obstetrics and Gynecology

## 2017-11-13 DIAGNOSIS — Z1231 Encounter for screening mammogram for malignant neoplasm of breast: Secondary | ICD-10-CM

## 2017-11-18 ENCOUNTER — Ambulatory Visit: Payer: BC Managed Care – PPO

## 2018-10-05 ENCOUNTER — Other Ambulatory Visit: Payer: Self-pay | Admitting: Obstetrics and Gynecology

## 2018-10-05 DIAGNOSIS — Z1231 Encounter for screening mammogram for malignant neoplasm of breast: Secondary | ICD-10-CM

## 2018-11-16 ENCOUNTER — Ambulatory Visit
Admission: RE | Admit: 2018-11-16 | Discharge: 2018-11-16 | Disposition: A | Discharge status: Home / Self Care | Payer: BC Managed Care – PPO | Source: Ambulatory Visit | Attending: Obstetrics and Gynecology | Admitting: Obstetrics and Gynecology

## 2018-11-16 DIAGNOSIS — Z1231 Encounter for screening mammogram for malignant neoplasm of breast: Secondary | ICD-10-CM

## 2019-09-16 ENCOUNTER — Other Ambulatory Visit: Payer: Self-pay | Admitting: Obstetrics and Gynecology

## 2019-09-16 DIAGNOSIS — Z1231 Encounter for screening mammogram for malignant neoplasm of breast: Secondary | ICD-10-CM

## 2019-09-22 ENCOUNTER — Ambulatory Visit
Admission: RE | Admit: 2019-09-22 | Discharge: 2019-09-22 | Disposition: A | Discharge status: Home / Self Care | Payer: BC Managed Care – PPO | Source: Ambulatory Visit | Attending: Family Medicine | Admitting: Family Medicine

## 2019-09-22 ENCOUNTER — Other Ambulatory Visit: Payer: Self-pay | Admitting: Family Medicine

## 2019-09-22 DIAGNOSIS — R0600 Dyspnea, unspecified: Secondary | ICD-10-CM

## 2019-11-30 ENCOUNTER — Other Ambulatory Visit: Payer: Self-pay

## 2019-11-30 ENCOUNTER — Ambulatory Visit
Admission: RE | Admit: 2019-11-30 | Discharge: 2019-11-30 | Disposition: A | Discharge status: Home / Self Care | Payer: BC Managed Care – PPO | Source: Ambulatory Visit | Attending: Obstetrics and Gynecology | Admitting: Obstetrics and Gynecology

## 2019-11-30 DIAGNOSIS — Z1231 Encounter for screening mammogram for malignant neoplasm of breast: Secondary | ICD-10-CM

## 2020-03-31 HISTORY — PX: OTHER SURGICAL HISTORY: SHX169

## 2020-04-03 ENCOUNTER — Other Ambulatory Visit: Payer: Self-pay | Admitting: Urology

## 2020-04-12 ENCOUNTER — Other Ambulatory Visit: Payer: Self-pay

## 2020-04-12 ENCOUNTER — Encounter (HOSPITAL_BASED_OUTPATIENT_CLINIC_OR_DEPARTMENT_OTHER): Payer: Self-pay | Admitting: Urology

## 2020-04-12 NOTE — Progress Notes (Signed)
Spoke w/ via phone for pre-op interview---patient Lab needs dos----  I stat 8, ekg           COVID test ------04-14-2020@1130  am Arrive at -------745 am 04-18-2020 NPO after ------midnight Medications to take morning of surgery -----atenolol, synthroid, cytomel myrebetriq Diabetic medication -----n/a Patient Special Instructions -----none Pre-Op special Istructions -----none Patient verbalized understanding of instructions that were given at this phone interview. Patient denies shortness of breath, chest pain, fever, cough a this phone interview.

## 2020-04-14 ENCOUNTER — Other Ambulatory Visit (HOSPITAL_COMMUNITY)
Admission: RE | Admit: 2020-04-14 | Discharge: 2020-04-14 | Disposition: A | Discharge status: Home / Self Care | Payer: BC Managed Care – PPO | Source: Ambulatory Visit | Attending: Urology | Admitting: Urology

## 2020-04-14 DIAGNOSIS — Z01812 Encounter for preprocedural laboratory examination: Secondary | ICD-10-CM | POA: Insufficient documentation

## 2020-04-14 DIAGNOSIS — Z20822 Contact with and (suspected) exposure to covid-19: Secondary | ICD-10-CM | POA: Diagnosis not present

## 2020-04-14 LAB — SARS CORONAVIRUS 2 (TAT 6-24 HRS): SARS Coronavirus 2: NEGATIVE

## 2020-04-17 NOTE — Anesthesia Preprocedure Evaluation (Addendum)
Anesthesia Evaluation  Patient identified by MRN, date of birth, ID band Patient awake    Reviewed: Allergy & Precautions, NPO status , Patient's Chart, lab work & pertinent test results  Airway Mallampati: II  TM Distance: >3 FB Neck ROM: Full    Dental no notable dental hx. (+) Teeth Intact, Dental Advisory Given   Pulmonary neg pulmonary ROS,    Pulmonary exam normal breath sounds clear to auscultation       Cardiovascular hypertension, Pt. on medications Normal cardiovascular exam Rhythm:Regular Rate:Normal  04-18-20 EKG NSR R 64 w NSST changes   Neuro/Psych  Headaches, Anxiety    GI/Hepatic negative GI ROS, Neg liver ROS, GERD  ,  Endo/Other  Hypothyroidism Hx of Thyroid CA  Renal/GU negative Renal ROSK+ 3.4 Cr 0.60     Musculoskeletal negative musculoskeletal ROS (+)   Abdominal (+) + obese,   Peds negative pediatric ROS (+)  Hematology negative hematology ROS (+) Hgb 12.6   Anesthesia Other Findings   Reproductive/Obstetrics                            Anesthesia Physical Anesthesia Plan  ASA: II  Anesthesia Plan: General   Post-op Pain Management:    Induction: Intravenous  PONV Risk Score and Plan: 4 or greater and Treatment may vary due to age or medical condition, Ondansetron, Dexamethasone and Midazolam  Airway Management Planned: LMA  Additional Equipment: None  Intra-op Plan:   Post-operative Plan:   Informed Consent: I have reviewed the patients History and Physical, chart, labs and discussed the procedure including the risks, benefits and alternatives for the proposed anesthesia with the patient or authorized representative who has indicated his/her understanding and acceptance.     Dental advisory given  Plan Discussed with: CRNA  Anesthesia Plan Comments:        Anesthesia Quick Evaluation

## 2020-04-18 ENCOUNTER — Encounter (HOSPITAL_BASED_OUTPATIENT_CLINIC_OR_DEPARTMENT_OTHER)
Admission: RE | Disposition: A | Discharge status: Home / Self Care | Payer: Self-pay | Source: Ambulatory Visit | Attending: Urology

## 2020-04-18 ENCOUNTER — Other Ambulatory Visit: Payer: Self-pay

## 2020-04-18 ENCOUNTER — Ambulatory Visit (HOSPITAL_BASED_OUTPATIENT_CLINIC_OR_DEPARTMENT_OTHER): Payer: BC Managed Care – PPO | Admitting: Anesthesiology

## 2020-04-18 ENCOUNTER — Encounter (HOSPITAL_BASED_OUTPATIENT_CLINIC_OR_DEPARTMENT_OTHER): Payer: Self-pay | Admitting: Urology

## 2020-04-18 ENCOUNTER — Ambulatory Visit (HOSPITAL_BASED_OUTPATIENT_CLINIC_OR_DEPARTMENT_OTHER)
Admission: RE | Admit: 2020-04-18 | Discharge: 2020-04-18 | Disposition: A | Discharge status: Home / Self Care | Payer: BC Managed Care – PPO | Source: Ambulatory Visit | Attending: Urology | Admitting: Urology

## 2020-04-18 DIAGNOSIS — C679 Malignant neoplasm of bladder, unspecified: Secondary | ICD-10-CM | POA: Insufficient documentation

## 2020-04-18 DIAGNOSIS — N3946 Mixed incontinence: Secondary | ICD-10-CM | POA: Diagnosis not present

## 2020-04-18 DIAGNOSIS — D494 Neoplasm of unspecified behavior of bladder: Secondary | ICD-10-CM | POA: Diagnosis present

## 2020-04-18 DIAGNOSIS — R3129 Other microscopic hematuria: Secondary | ICD-10-CM | POA: Diagnosis not present

## 2020-04-18 DIAGNOSIS — Z79899 Other long term (current) drug therapy: Secondary | ICD-10-CM | POA: Insufficient documentation

## 2020-04-18 HISTORY — DX: Migraine, unspecified, not intractable, without status migrainosus: G43.909

## 2020-04-18 HISTORY — DX: Neoplasm of unspecified behavior of bladder: D49.4

## 2020-04-18 HISTORY — DX: Gastro-esophageal reflux disease without esophagitis: K21.9

## 2020-04-18 HISTORY — PX: TRANSURETHRAL RESECTION OF BLADDER TUMOR WITH MITOMYCIN-C: SHX6459

## 2020-04-18 LAB — POCT I-STAT, CHEM 8
BUN: 13 mg/dL (ref 6–20)
Calcium, Ion: 1.17 mmol/L (ref 1.15–1.40)
Chloride: 103 mmol/L (ref 98–111)
Creatinine, Ser: 0.6 mg/dL (ref 0.44–1.00)
Glucose, Bld: 97 mg/dL (ref 70–99)
HCT: 37 % (ref 36.0–46.0)
Hemoglobin: 12.6 g/dL (ref 12.0–15.0)
Potassium: 3.4 mmol/L — ABNORMAL LOW (ref 3.5–5.1)
Sodium: 142 mmol/L (ref 135–145)
TCO2: 27 mmol/L (ref 22–32)

## 2020-04-18 SURGERY — TRANSURETHRAL RESECTION OF BLADDER TUMOR WITH MITOMYCIN-C
Anesthesia: General | Site: Bladder

## 2020-04-18 MED ORDER — FENTANYL CITRATE (PF) 100 MCG/2ML IJ SOLN
25.0000 ug | INTRAMUSCULAR | Status: DC | PRN
  Administered 2020-04-18: 25 ug via INTRAVENOUS

## 2020-04-18 MED ORDER — SODIUM CHLORIDE 0.9 % IR SOLN
Status: DC | PRN
  Administered 2020-04-18: 3000 mL via INTRAVESICAL

## 2020-04-18 MED ORDER — MEPERIDINE HCL 25 MG/ML IJ SOLN: 6.2500 mg | INTRAMUSCULAR | Status: DC | PRN

## 2020-04-18 MED ORDER — FENTANYL CITRATE (PF) 100 MCG/2ML IJ SOLN
INTRAMUSCULAR | Status: AC
  Filled 2020-04-18: qty 2

## 2020-04-18 MED ORDER — MIDAZOLAM HCL 5 MG/5ML IJ SOLN
INTRAMUSCULAR | Status: DC | PRN
  Administered 2020-04-18: 2 mg via INTRAVENOUS

## 2020-04-18 MED ORDER — ONDANSETRON HCL 4 MG/2ML IJ SOLN
INTRAMUSCULAR | Status: DC | PRN
  Administered 2020-04-18: 4 mg via INTRAVENOUS

## 2020-04-18 MED ORDER — CEFAZOLIN SODIUM-DEXTROSE 2-4 GM/100ML-% IV SOLN
2.0000 g | INTRAVENOUS | Status: AC
  Administered 2020-04-18: 2 g via INTRAVENOUS

## 2020-04-18 MED ORDER — CEFAZOLIN SODIUM-DEXTROSE 2-4 GM/100ML-% IV SOLN
INTRAVENOUS | Status: AC
  Filled 2020-04-18: qty 100

## 2020-04-18 MED ORDER — GEMCITABINE CHEMO FOR BLADDER INSTILLATION 2000 MG
2000.0000 mg | Freq: Once | INTRAVENOUS | Status: AC
  Administered 2020-04-18: 2000 mg via INTRAVESICAL
  Filled 2020-04-18: qty 2000

## 2020-04-18 MED ORDER — OXYCODONE HCL 5 MG PO TABS: 5.0000 mg | ORAL_TABLET | Freq: Once | ORAL | Status: DC | PRN

## 2020-04-18 MED ORDER — OXYCODONE HCL 5 MG/5ML PO SOLN: 5.0000 mg | Freq: Once | ORAL | Status: DC | PRN

## 2020-04-18 MED ORDER — ACETAMINOPHEN 10 MG/ML IV SOLN: 1000.0000 mg | Freq: Once | INTRAVENOUS | Status: DC | PRN

## 2020-04-18 MED ORDER — HYDROMORPHONE HCL 1 MG/ML IJ SOLN: 0.2500 mg | INTRAMUSCULAR | Status: DC | PRN

## 2020-04-18 MED ORDER — LIDOCAINE 2% (20 MG/ML) 5 ML SYRINGE
INTRAMUSCULAR | Status: DC | PRN
  Administered 2020-04-18: 80 mg via INTRAVENOUS

## 2020-04-18 MED ORDER — LACTATED RINGERS IV SOLN: INTRAVENOUS | Status: DC

## 2020-04-18 MED ORDER — HYDROCODONE-ACETAMINOPHEN 5-325 MG PO TABS: 1.0000 | ORAL_TABLET | ORAL | 0 refills | Status: DC | PRN

## 2020-04-18 MED ORDER — ONDANSETRON HCL 4 MG/2ML IJ SOLN: 4.0000 mg | Freq: Once | INTRAMUSCULAR | Status: DC | PRN

## 2020-04-18 MED ORDER — LIDOCAINE 2% (20 MG/ML) 5 ML SYRINGE
INTRAMUSCULAR | Status: AC
  Filled 2020-04-18: qty 5

## 2020-04-18 MED ORDER — FENTANYL CITRATE (PF) 250 MCG/5ML IJ SOLN
INTRAMUSCULAR | Status: DC | PRN
  Administered 2020-04-18: 50 ug via INTRAVENOUS

## 2020-04-18 MED ORDER — MIDAZOLAM HCL 2 MG/2ML IJ SOLN
INTRAMUSCULAR | Status: AC
  Filled 2020-04-18: qty 2

## 2020-04-18 MED ORDER — PROPOFOL 10 MG/ML IV BOLUS
INTRAVENOUS | Status: DC | PRN
  Administered 2020-04-18: 150 mg via INTRAVENOUS

## 2020-04-18 MED ORDER — DEXAMETHASONE SODIUM PHOSPHATE 10 MG/ML IJ SOLN
INTRAMUSCULAR | Status: DC | PRN
  Administered 2020-04-18: 4 mg via INTRAVENOUS

## 2020-04-18 SURGICAL SUPPLY — 24 items
BAG DRAIN URO-CYSTO SKYTR STRL (DRAIN) ×2 IMPLANT
BAG DRN RND TRDRP ANRFLXCHMBR (UROLOGICAL SUPPLIES) ×1
BAG DRN UROCATH (DRAIN) ×1
BAG URINE DRAIN 2000ML AR STRL (UROLOGICAL SUPPLIES) ×1 IMPLANT
BAG URINE LEG 500ML (DRAIN) IMPLANT
CATH FOLEY 2WAY SLVR  5CC 22FR (CATHETERS)
CATH FOLEY 2WAY SLVR 30CC 20FR (CATHETERS) ×1 IMPLANT
CATH FOLEY 2WAY SLVR 5CC 22FR (CATHETERS) IMPLANT
CLOTH BEACON ORANGE TIMEOUT ST (SAFETY) ×2 IMPLANT
ELECT REM PT RETURN 9FT ADLT (ELECTROSURGICAL)
ELECTRODE REM PT RTRN 9FT ADLT (ELECTROSURGICAL) ×1 IMPLANT
EVACUATOR MICROVAS BLADDER (UROLOGICAL SUPPLIES) IMPLANT
GLOVE BIO SURGEON STRL SZ7.5 (GLOVE) ×2 IMPLANT
GOWN STRL REUS W/ TWL LRG LVL3 (GOWN DISPOSABLE) ×1 IMPLANT
GOWN STRL REUS W/TWL LRG LVL3 (GOWN DISPOSABLE) ×2
HOLDER FOLEY CATH W/STRAP (MISCELLANEOUS) ×1 IMPLANT
IV NS IRRIG 3000ML ARTHROMATIC (IV SOLUTION) ×3 IMPLANT
KIT TURNOVER CYSTO (KITS) ×2 IMPLANT
MANIFOLD NEPTUNE II (INSTRUMENTS) ×1 IMPLANT
PLUG CATH AND CAP STER (CATHETERS) ×1 IMPLANT
SYR TOOMEY IRRIG 70ML (MISCELLANEOUS)
SYRINGE TOOMEY IRRIG 70ML (MISCELLANEOUS) IMPLANT
TRAY CYSTO PACK (CUSTOM PROCEDURE TRAY) ×2 IMPLANT
TUBE CONNECTING 12X1/4 (SUCTIONS) ×1 IMPLANT

## 2020-04-18 NOTE — Op Note (Signed)
Operative Note  Preoperative diagnosis:  1.  Bladder tumor  Postoperative diagnosis: 1.  Bladder tumor--medium  Procedure(s): 1.  Transurethral resection of bladder tumor--medium 2.  Intravesical instillation of chemotherapy--gemcitabine  Surgeon: Link Snuffer, MD  Assistants: None  Anesthesia: General  Complications: None immediate  EBL: Minimal  Specimens: 1.  Bladder tumor  Drains/Catheters: 1.  20 French Foley catheter  Intraoperative findings: 1.  Normal urethra 2.  Approximately 2 cm papillary bladder tumor that appeared superficial was just lateral to the left ureteral orifice.  There was a separate subcentimeter papillary lesion just lateral to this.  All tumor was superficially resected.  No other bladder tumors.  The ureteral orifice was not fulgurated or resected but resection was quite close to it.  Indication: 59 year old female found to have a bladder tumor presents for the previously mentioned operation  Description of procedure:  The patient was identified and consent was obtained.  The patient was taken to the operating room and placed in the supine position.  The patient was placed under general anesthesia.  Perioperative antibiotics were administered.  The patient was placed in dorsal lithotomy.  Patient was prepped and draped in a standard sterile fashion and a timeout was performed.  A 26 French resectoscope with a visual obturator in place was advanced into the urethra and into the bladder.  This was exchanged for the resectoscope component.  I inspected the entire bladder mucosa with the findings noted above.  I proceeded to resect the bladder tumors of interest completely with bipolar settings.  Resection bed was fulgurated.  I collected the specimen for permanent specimen.  I reinspected the bladder mucosa and there was no bleeding noted.  No other bladder tumors noted.  I placed a 59 French Foley catheter and this concluded the operation.  Patient  tolerated procedure well was stable postoperative.  In the PACU, I instilled intravesical gemcitabine which remained for approximately 1 hour.  Plan: Follow-up in about 1 week for pathology review

## 2020-04-18 NOTE — Anesthesia Postprocedure Evaluation (Signed)
Anesthesia Post Note  Patient: Carla Rojas  Procedure(s) Performed: TRANSURETHRAL RESECTION OF BLADDER TUMOR WITH POST OPERATIVE INSTILLATION OF GEMCITABINE (N/A Bladder)     Patient location during evaluation: PACU Anesthesia Type: General Level of consciousness: awake and alert Pain management: pain level controlled Vital Signs Assessment: post-procedure vital signs reviewed and stable Respiratory status: spontaneous breathing, nonlabored ventilation, respiratory function stable and patient connected to nasal cannula oxygen Cardiovascular status: blood pressure returned to baseline and stable Postop Assessment: no apparent nausea or vomiting Anesthetic complications: no    Last Vitals:  Vitals:   04/18/20 1030 04/18/20 1045  BP: (!) 143/94 (!) 146/87  Pulse: 65 64  Resp: 13 14  Temp:    SpO2: 100% 98%    Last Pain:  Vitals:   04/18/20 1130  TempSrc:   PainSc: 0-No pain                 Barnet Glasgow

## 2020-04-18 NOTE — Discharge Instructions (Addendum)
Transurethral Resection of Bladder Tumor (TURBT) or Bladder Biopsy ° ° °Definition: ° Transurethral Resection of the Bladder Tumor is a surgical procedure used to diagnose and remove tumors within the bladder. TURBT is the most common treatment for early stage bladder cancer. ° °General instructions: °   ° Your recent bladder surgery requires very little post hospital care but some definite precautions. ° °Despite the fact that no skin incisions were used, the area around the bladder incisions are raw and covered with scabs to promote healing and prevent bleeding. Certain precautions are needed to insure that the scabs are not disturbed over the next 2-4 weeks while the healing proceeds. ° °Because the raw surface inside your bladder and the irritating effects of urine you may expect frequency of urination and/or urgency (a stronger desire to urinate) and perhaps even getting up at night more often. This will usually resolve or improve slowly over the healing period. You may see some blood in your urine over the first 6 weeks. Do not be alarmed, even if the urine was clear for a while. Get off your feet and drink lots of fluids until clearing occurs. If you start to pass clots or don't improve call us. ° °Diet: ° °You may return to your normal diet immediately. Because of the raw surface of your bladder, alcohol, spicy foods, foods high in acid and drinks with caffeine may cause irritation or frequency and should be used in moderation. To keep your urine flowing freely and avoid constipation, drink plenty of fluids during the day (8-10 glasses). Tip: Avoid cranberry juice because it is very acidic. ° °Activity: ° °Your physical activity doesn't need to be restricted. However, if you are very active, you may see some blood in the urine. We suggest that you reduce your activity under the circumstances until the bleeding has stopped. ° °Bowels: ° °It is important to keep your bowels regular during the postoperative  period. Straining with bowel movements can cause bleeding. A bowel movement every other day is reasonable. Use a mild laxative if needed, such as milk of magnesia 2-3 tablespoons, or 2 Dulcolax tablets. Call if you continue to have problems. If you had been taking narcotics for pain, before, during or after your surgery, you may be constipated. Take a laxative if necessary. ° ° ° °Medication: ° °You should resume your pre-surgery medications unless told not to. In addition you may be given an antibiotic to prevent or treat infection. Antibiotics are not always necessary. All medication should be taken as prescribed until the bottles are finished unless you are having an unusual reaction to one of the drugs. ° ° ° ° ° °Post Anesthesia Home Care Instructions ° °Activity: °Get plenty of rest for the remainder of the day. A responsible adult should stay with you for 24 hours following the procedure.  °For the next 24 hours, DO NOT: °-Drive a car °-Operate machinery °-Drink alcoholic beverages °-Take any medication unless instructed by your physician °-Make any legal decisions or sign important papers. ° °Meals: °Start with liquid foods such as gelatin or soup. Progress to regular foods as tolerated. Avoid greasy, spicy, heavy foods. If nausea and/or vomiting occur, drink only clear liquids until the nausea and/or vomiting subsides. Call your physician if vomiting continues. ° °Special Instructions/Symptoms: °Your throat may feel dry or sore from the anesthesia or the breathing tube placed in your throat during surgery. If this causes discomfort, gargle with warm salt water. The discomfort should disappear within 24   hours. ° °If you had a scopolamine patch placed behind your ear for the management of post- operative nausea and/or vomiting: ° °1. The medication in the patch is effective for 72 hours, after which it should be removed.  Wrap patch in a tissue and discard in the trash. Wash hands thoroughly with soap and  water. °2. You may remove the patch earlier than 72 hours if you experience unpleasant side effects which may include dry mouth, dizziness or visual disturbances. °3. Avoid touching the patch. Wash your hands with soap and water after contact with the patch. °  ° °

## 2020-04-18 NOTE — Transfer of Care (Signed)
Immediate Anesthesia Transfer of Care Note  Patient: Carla Rojas  Procedure(s) Performed: TRANSURETHRAL RESECTION OF BLADDER TUMOR WITH GEMCITABINE (N/A )  Patient Location: PACU  Anesthesia Type:General  Level of Consciousness: awake, alert , oriented and patient cooperative  Airway & Oxygen Therapy: Patient Spontanous Breathing and Patient connected to face mask oxygen  Post-op Assessment: Report given to RN, Post -op Vital signs reviewed and stable and Patient moving all extremities  Post vital signs: Reviewed and stable  Last Vitals:  Vitals Value Taken Time  BP 136/97 04/18/20 1000  Temp 36.4 C 04/18/20 0958  Pulse 65 04/18/20 1001  Resp 13 04/18/20 1001  SpO2 100 % 04/18/20 1001  Vitals shown include unvalidated device data.  Last Pain:  Vitals:   04/18/20 0807  TempSrc: Oral         Complications: No apparent anesthesia complications

## 2020-04-18 NOTE — Anesthesia Procedure Notes (Signed)
Procedure Name: LMA Insertion Date/Time: 04/18/2020 9:23 AM Performed by: Myna Bright, CRNA Pre-anesthesia Checklist: Patient identified, Emergency Drugs available, Suction available and Patient being monitored Patient Re-evaluated:Patient Re-evaluated prior to induction Oxygen Delivery Method: Circle system utilized Preoxygenation: Pre-oxygenation with 100% oxygen Induction Type: IV induction Ventilation: Mask ventilation without difficulty LMA: LMA with gastric port inserted LMA Size: 4.0 Number of attempts: 1 Placement Confirmation: positive ETCO2 and breath sounds checked- equal and bilateral Tube secured with: Tape Dental Injury: Teeth and Oropharynx as per pre-operative assessment

## 2020-04-18 NOTE — Interval H&P Note (Signed)
History and Physical Interval Note:  04/18/2020 8:50 AM  Carla Rojas  has presented today for surgery, with the diagnosis of BLADDER TUMOR.  The various methods of treatment have been discussed with the patient and family. After consideration of risks, benefits and other options for treatment, the patient has consented to  Procedure(s): TRANSURETHRAL RESECTION OF BLADDER TUMOR WITH GEMCITABINE (N/A) as a surgical intervention.  The patient's history has been reviewed, patient examined, no change in status, stable for surgery.  I have reviewed the patient's chart and labs.  Questions were answered to the patient's satisfaction.     Marton Redwood, III

## 2020-04-18 NOTE — H&P (Signed)
CC/HPI: Referral from Dr. Matilde Sprang who followed the patient for mixed urinary incontinence. She was found to have microscopic hematuria. CT hematuria protocol revealed a filling defect in the bladder. Cystoscopy confirmed a small bladder tumor next to the left ureteral vesicular junction and a possible tumor at 4 o'clock. Patient denies gross hematuria. She has no complaints today.     ALLERGIES: None   MEDICATIONS: Myrbetriq 50 mg tablet, extended release 24 hr 1 tablet PO Daily  Atenolol 100 mg tablet  Atorvastatin Calcium 20 mg tablet  Cytomel 5 mcg tablet  Sertraline Hcl 100 mg tablet  Synthroid 100 mcg tablet  Ubrelvy 100 mg tablet     GU PSH: Cystoscopy - 03/21/2020 Locm 300-399Mg /Ml Iodine,1Ml - 03/14/2020     NON-GU PSH: None   GU PMH: Microscopic hematuria - 02/01/2020 Mixed incontinence - 12/29/2019 Urinary Frequency - 12/29/2019    NON-GU PMH: None   FAMILY HISTORY: None   SOCIAL HISTORY: Marital Status: Married    REVIEW OF SYSTEMS:    GU Review Female:   Patient reports get up at night to urinate, leakage of urine, and stream starts and stops. Patient denies frequent urination, hard to postpone urination, burning /pain with urination, trouble starting your stream, have to strain to urinate, and being pregnant.  Gastrointestinal (Upper):   Patient reports indigestion/ heartburn. Patient denies nausea and vomiting.  Gastrointestinal (Lower):   Patient reports constipation. Patient denies diarrhea.  Constitutional:   Patient reports night sweats. Patient denies fever, weight loss, and fatigue.  Skin:   Patient denies skin rash/ lesion and itching.  Eyes:   Patient denies blurred vision and double vision.  Ears/ Nose/ Throat:   Patient denies sore throat and sinus problems.  Hematologic/Lymphatic:   Patient denies swollen glands and easy bruising.  Cardiovascular:   Patient denies leg swelling and chest pains.  Respiratory:   Patient denies cough and shortness of  breath.  Endocrine:   Patient denies excessive thirst.  Musculoskeletal:   Patient denies back pain and joint pain.  Neurological:   Patient reports headaches. Patient denies dizziness.  Psychologic:   Patient reports anxiety. Patient denies depression.   Notes: Patient says her urine has a odor.    VITAL SIGNS:      04/03/2020 08:18 AM  Weight 209 lb / 94.8 kg  Height 66 in / 167.64 cm  BP 124/86 mmHg  Heart Rate 63 /min  Temperature 98.0 F / 36.6 C  BMI 33.7 kg/m   Complexity of Data:  Records Review:   Previous Doctor Records, Previous Patient Records  Urine Test Review:   Urinalysis  X-Ray Review: C.T. Abdomen/Pelvis: Reviewed Films. Reviewed Report. Discussed With Patient.     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 She understands potential for bleeding, infection, injury to surrounding structures including potential for bladder perforation.   Cc: Dr. Brigitte Pulse  Dr. Alroy Dust  Dr. Matilde Sprang    Signed by Link Snuffer, III, M.D. on 04/03/20 at 8:51 AM (EDT

## 2020-04-19 LAB — SURGICAL PATHOLOGY

## 2020-10-17 ENCOUNTER — Other Ambulatory Visit: Payer: Self-pay | Admitting: Obstetrics and Gynecology

## 2020-10-17 DIAGNOSIS — Z1231 Encounter for screening mammogram for malignant neoplasm of breast: Secondary | ICD-10-CM

## 2020-12-04 ENCOUNTER — Ambulatory Visit
Admission: RE | Admit: 2020-12-04 | Discharge: 2020-12-04 | Disposition: A | Discharge status: Home / Self Care | Payer: BC Managed Care – PPO | Source: Ambulatory Visit | Attending: Obstetrics and Gynecology | Admitting: Obstetrics and Gynecology

## 2020-12-04 ENCOUNTER — Other Ambulatory Visit: Payer: Self-pay

## 2020-12-04 DIAGNOSIS — Z1231 Encounter for screening mammogram for malignant neoplasm of breast: Secondary | ICD-10-CM

## 2021-03-01 IMAGING — MG DIGITAL SCREENING BILAT W/ TOMO W/ CAD
8 series · 8 of 24 positions shown · non-contrast
Comparison: Previous exam(s).

ACR Breast Density Category a: The breast tissue is almost entirely
fatty.

CLINICAL DATA: Screening.

EXAM:
DIGITAL SCREENING BILATERAL MAMMOGRAM WITH TOMO AND CAD

[R MLO synth-2D]
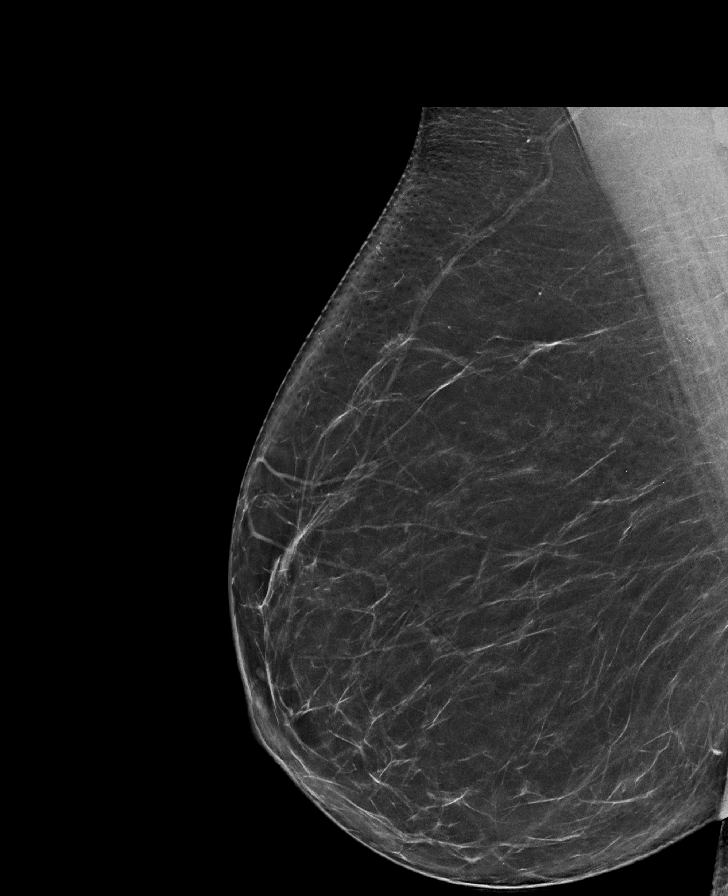

[R CC synth-2D]
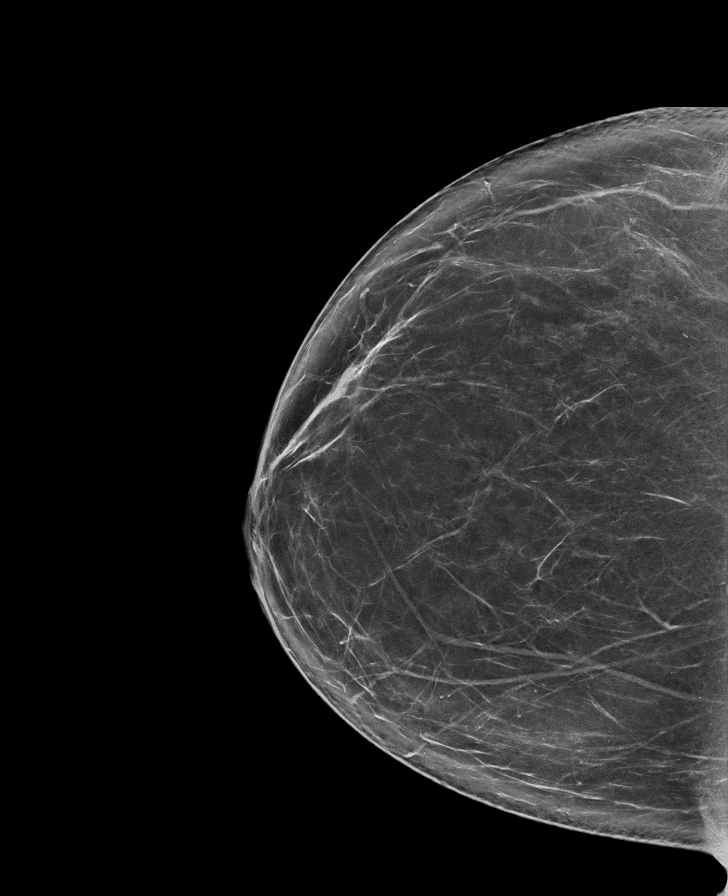

[L CC synth-2D]
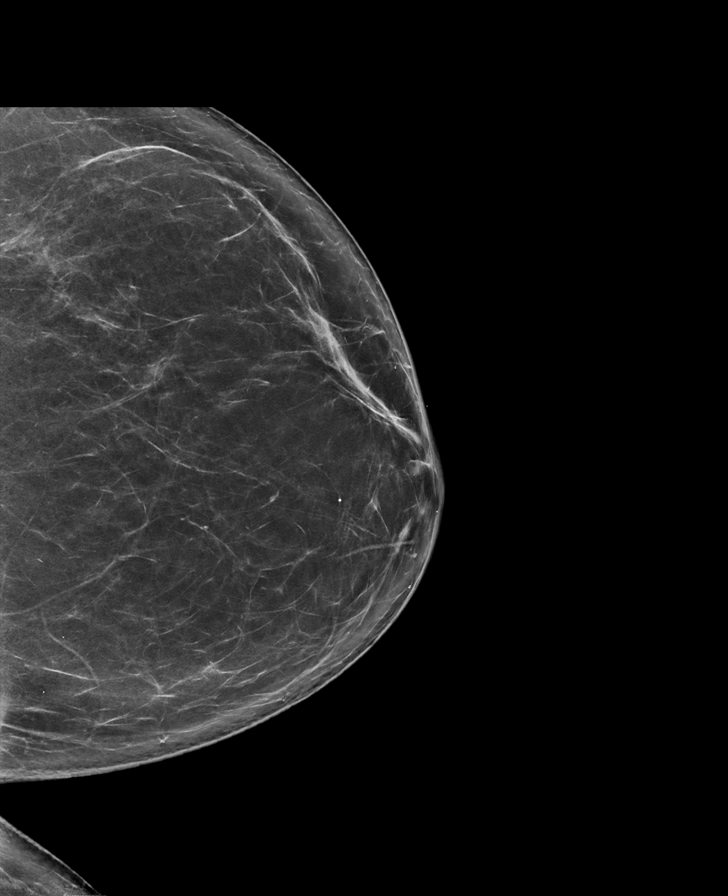

[L MLO synth-2D]
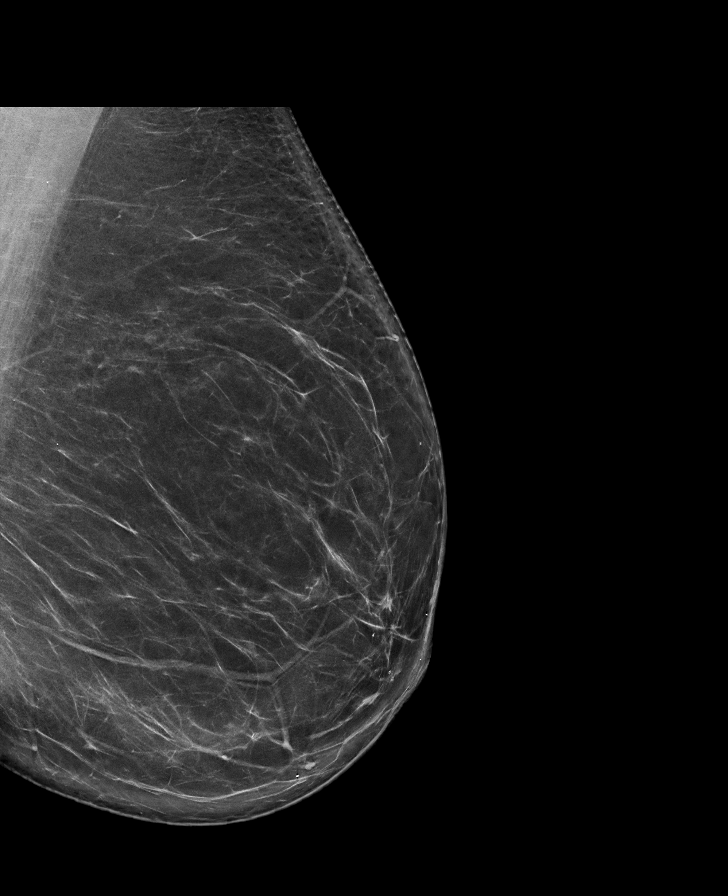

[L MLO tomo · tomo slice 45/89.0]
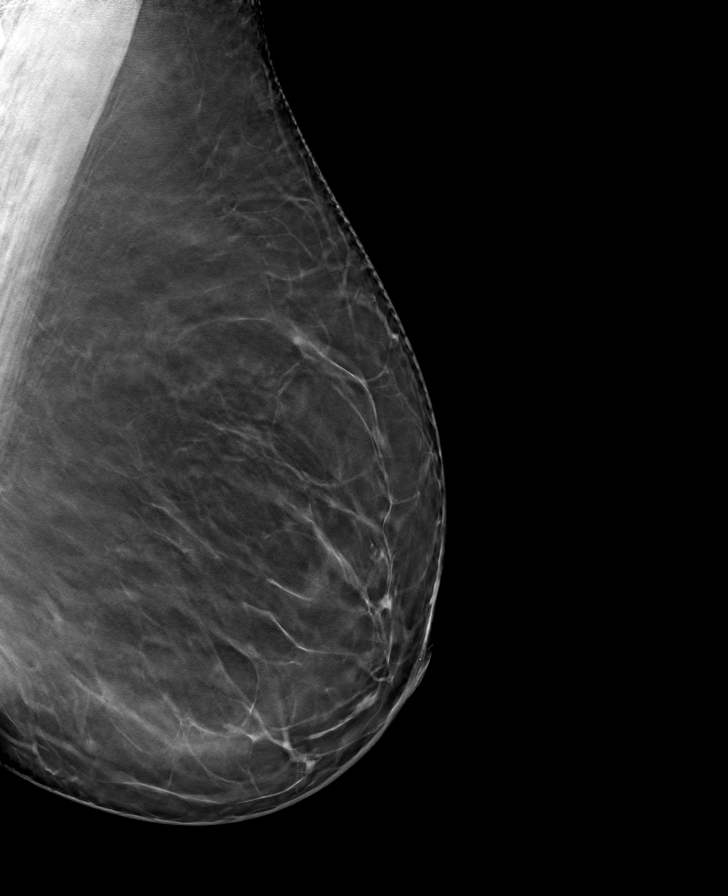

[R MLO tomo · tomo slice 45/88.0]
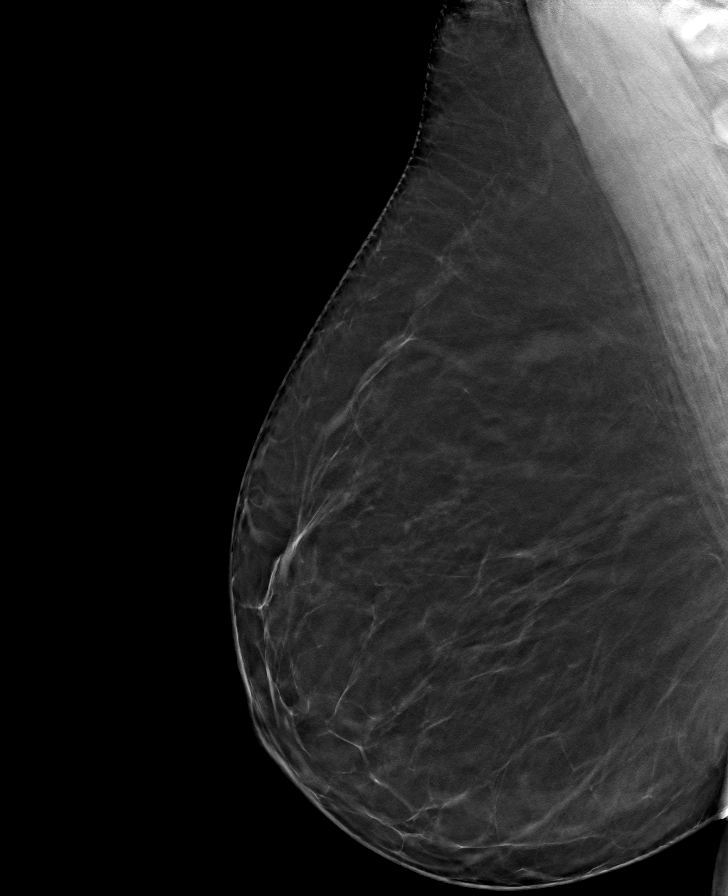

[L CC tomo · tomo slice 42/83.0]
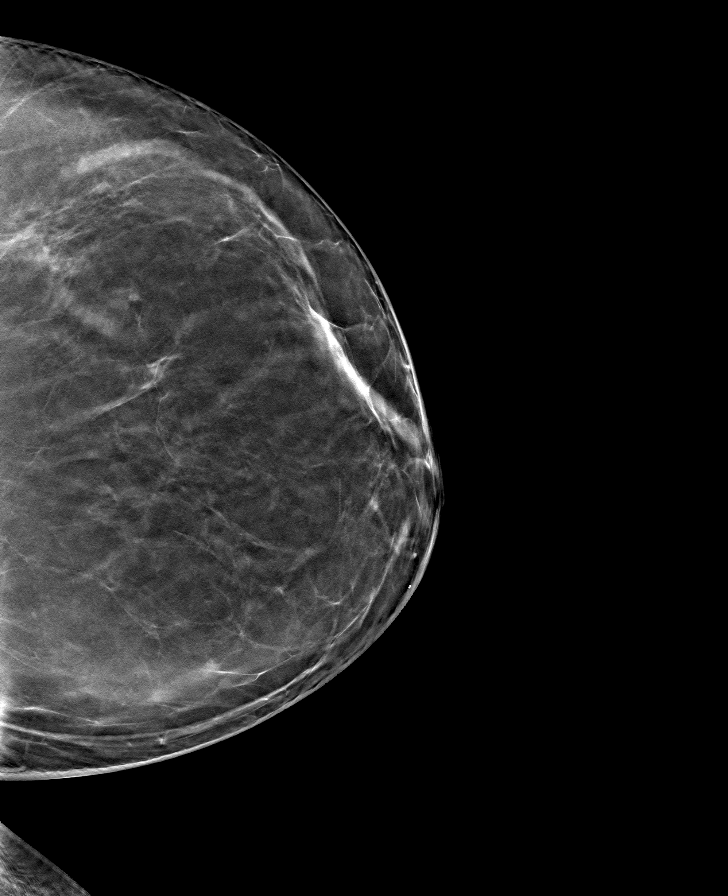

[R CC tomo · tomo slice 45/88.0]
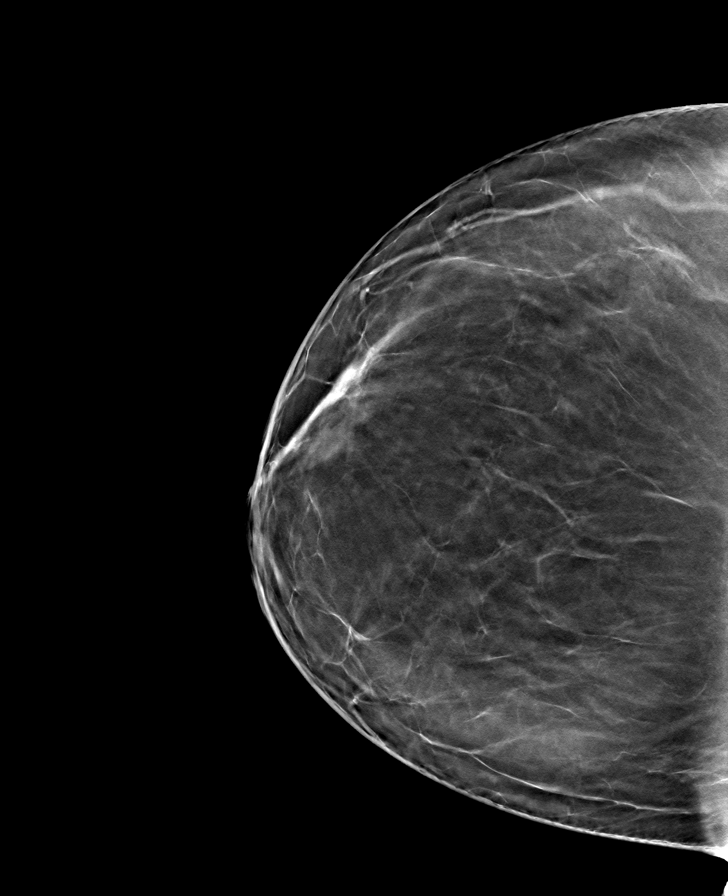

[8 of 24 positions shown; findings below may reference images not displayed]

FINDINGS: There are no findings suspicious for malignancy. Images were
processed with CAD.
IMPRESSION: No mammographic evidence of malignancy. A result letter of this
screening mammogram will be mailed directly to the patient.

RECOMMENDATION:
Screening mammogram in one year. (Code:[id number])

BI-RADS CATEGORY  1: Negative.

The please Insert Correct Screening Template

## 2021-08-21 ENCOUNTER — Other Ambulatory Visit: Payer: Self-pay | Admitting: Urology

## 2021-08-28 NOTE — Patient Instructions (Signed)
DUE TO COVID-19 ONLY ONE VISITOR IS ALLOWED TO COME WITH YOU AND STAY IN THE WAITING ROOM ONLY DURING PRE OP AND PROCEDURE.   **NO VISITORS ARE ALLOWED IN THE SHORT STAY AREA OR RECOVERY ROOM!!**  IF YOU WILL BE ADMITTED INTO THE HOSPITAL YOU ARE ALLOWED ONLY TWO SUPPORT PEOPLE DURING VISITATION HOURS ONLY (10AM -8PM)   The support person(s) may change daily. The support person(s) must pass our screening, gel in and out, and wear a mask at all times, including in the patient's room. Patients must also wear a mask when staff or their support person are in the room.  No visitors under the age of 31. Any visitor under the age of 65 must be accompanied by an adult.     Your procedure is scheduled on: 09/02/21   Report to [facility] Main Entrance    Report to admitting at: 11:45 AM   Call this number if you have problems the morning of surgery [phone]   Do not eat food :After Midnight.   May have liquids until : 11:00 AM   day of surgery  CLEAR LIQUID DIET  Foods Allowed                                                                     Foods Excluded  Water, Black Coffee and tea, regular and decaf                             liquids that you cannot  Plain Jell-O in any flavor  (No red)                                           see through such as: Fruit ices (not with fruit pulp)                                     milk, soups, orange juice              Iced Popsicles (No red)                                    All solid food                                   Apple juices Sports drinks like Gatorade (No red) Lightly seasoned clear broth or consume(fat free) Sugar,   Sample Menu Breakfast                                Lunch                                     Supper Cranberry juice  Beef broth                            Chicken broth Jell-O                                     Grape juice                           Apple juice Coffee or tea                         Jell-O                                      Popsicle                                                Coffee or tea                        Coffee or tea     Oral Hygiene is also important to reduce your risk of infection.                                    Remember - BRUSH YOUR TEETH THE MORNING OF SURGERY WITH YOUR REGULAR TOOTHPASTE   Do NOT smoke after Midnight   Take these medicines the morning of surgery with A SIP OF WATER: atenolol,synthroid,liothyronine.Ubrogepantas as needed.  DO NOT TAKE ANY ORAL DIABETIC MEDICATIONS DAY OF YOUR SURGERY                              You may not have any metal on your body including hair pins, jewelry, and body piercing             Do not wear make-up, lotions, powders, perfumes/cologne, or deodorant  Do not wear nail polish including gel and S&S, artificial/acrylic nails, or any other type of covering on natural nails including finger and toenails. If you have artificial nails, gel coating, etc. that needs to be removed by a nail salon please have this removed prior to surgery or surgery may need to be canceled/ delayed if the surgeon/ anesthesia feels like they are unable to be safely monitored.   Do not shave  48 hours prior to surgery.    Do not bring valuables to the hospital. [place].   Contacts, dentures or bridgework may not be worn into surgery.   Bring small overnight bag day of surgery.    Patients discharged on the day of surgery will not be allowed to drive home.   Special Instructions: Bring a copy of your healthcare power of attorney and living will documents         the day of surgery if you haven't scanned them before.  Please read over the following fact sheets you were given: IF YOU HAVE QUESTIONS ABOUT YOUR PRE-OP INSTRUCTIONS PLEASE CALL [phone]   [facility] - Preparing for Surgery Before surgery, you can play an important role.  Because skin  is not sterile, your skin needs to be as free of germs as possible.  You can reduce the number of germs on your skin by washing with CHG (chlorahexidine gluconate) soap before surgery.  CHG is an antiseptic cleaner which kills germs and bonds with the skin to continue killing germs even after washing. Please DO NOT use if you have an allergy to CHG or antibacterial soaps.  If your skin becomes reddened/irritated stop using the CHG and inform your nurse when you arrive at Short Stay. Do not shave (including legs and underarms) for at least 48 hours prior to the first CHG shower.  You may shave your face/neck. Please follow these instructions carefully:  1.  Shower with CHG Soap the night before surgery and the  morning of Surgery.  2.  If you choose to wash your hair, wash your hair first as usual with your  normal  shampoo.  3.  After you shampoo, rinse your hair and body thoroughly to remove the  shampoo.                           4.  Use CHG as you would any other liquid soap.  You can apply chg directly  to the skin and wash                       Gently with a scrungie or clean washcloth.  5.  Apply the CHG Soap to your body ONLY FROM THE NECK DOWN.   Do not use on face/ open                           Wound or open sores. Avoid contact with eyes, ears mouth and genitals (private parts).                       Wash face,  Genitals (private parts) with your normal soap.             6.  Wash thoroughly, paying special attention to the area where your surgery  will be performed.  7.  Thoroughly rinse your body with warm water from the neck down.  8.  DO NOT shower/wash with your normal soap after using and rinsing off  the CHG Soap.                9.  Pat yourself dry with a clean towel.            10.  Wear clean pajamas.            11.  Place clean sheets on your bed the night of your first shower and do not  sleep with pets. Day of Surgery : Do not apply any lotions/deodorants the morning of  surgery.  Please wear clean clothes to the hospital/surgery center.  FAILURE TO FOLLOW THESE INSTRUCTIONS MAY RESULT IN THE CANCELLATION OF YOUR SURGERY PATIENT SIGNATURE_________________________________  NURSE SIGNATURE__________________________________  ________________________________________________________________________

## 2021-08-29 ENCOUNTER — Encounter (HOSPITAL_COMMUNITY): Payer: Self-pay

## 2021-08-29 ENCOUNTER — Encounter (HOSPITAL_COMMUNITY)
Admission: RE | Admit: 2021-08-29 | Discharge: 2021-08-29 | Disposition: A | Discharge status: Home / Self Care | Payer: BC Managed Care – PPO | Source: Ambulatory Visit | Attending: Urology | Admitting: Urology

## 2021-08-29 ENCOUNTER — Other Ambulatory Visit: Payer: Self-pay

## 2021-08-29 DIAGNOSIS — Z01818 Encounter for other preprocedural examination: Secondary | ICD-10-CM | POA: Diagnosis not present

## 2021-08-29 LAB — BASIC METABOLIC PANEL
Anion gap: 7 (ref 5–15)
BUN: 10 mg/dL (ref 6–20)
CO2: 28 mmol/L (ref 22–32)
Calcium: 8.6 mg/dL — ABNORMAL LOW (ref 8.9–10.3)
Chloride: 103 mmol/L (ref 98–111)
Creatinine, Ser: 0.77 mg/dL (ref 0.44–1.00)
GFR, Estimated: >60 mL/min (ref >60)
Glucose, Bld: 93 mg/dL (ref 70–99)
Potassium: 3.6 mmol/L (ref 3.5–5.1)
Sodium: 138 mmol/L (ref 135–145)

## 2021-08-29 LAB — CBC
HCT: 41.8 % (ref 36.0–46.0)
Hemoglobin: 13.3 g/dL (ref 12.0–15.0)
MCH: 28.2 pg (ref 26.0–34.0)
MCHC: 31.8 g/dL (ref 30.0–36.0)
MCV: 88.6 fL (ref 80.0–100.0)
Platelets: 306 10*3/uL (ref 150–400)
RBC: 4.72 MIL/uL (ref 3.87–5.11)
RDW: 13 % (ref 11.5–15.5)
WBC: 7 10*3/uL (ref 4.0–10.5)
nRBC: 0 % (ref 0.0–0.2)

## 2021-08-29 NOTE — Progress Notes (Addendum)
COVID Vaccine Completed: Yes Date COVID Vaccine completed: 03/09/20, 04/03/20 COVID vaccine manufacturer:     Moderna    COVID Test: N/A. PCP - Dr. Mayra Neer Cardiologist -   Chest x-ray -  EKG -  Stress Test -  ECHO -  Cardiac Cath -  Pacemaker/ICD device last checked:  Sleep Study -  CPAP -   Fasting Blood Sugar -  Checks Blood Sugar _____ times a day  Blood Thinner Instructions: Aspirin Instructions: Last Dose:  Anesthesia review:   Patient denies shortness of breath, fever, cough and chest pain at PAT appointment   Patient verbalized understanding of instructions that were given to them at the PAT appointment. Patient was also instructed that they will need to review over the PAT instructions again at home before surgery.

## 2021-09-02 ENCOUNTER — Encounter (HOSPITAL_COMMUNITY): Payer: Self-pay | Admitting: Urology

## 2021-09-02 ENCOUNTER — Ambulatory Visit (HOSPITAL_COMMUNITY): Payer: BC Managed Care – PPO | Admitting: Anesthesiology

## 2021-09-02 ENCOUNTER — Encounter (HOSPITAL_COMMUNITY)
Admission: RE | Disposition: A | Discharge status: Home / Self Care | Payer: Self-pay | Source: Home / Self Care | Attending: Urology

## 2021-09-02 ENCOUNTER — Ambulatory Visit (HOSPITAL_COMMUNITY)
Admission: RE | Admit: 2021-09-02 | Discharge: 2021-09-02 | Disposition: A | Discharge status: Home / Self Care | Payer: BC Managed Care – PPO | Attending: Urology | Admitting: Urology

## 2021-09-02 DIAGNOSIS — C679 Malignant neoplasm of bladder, unspecified: Secondary | ICD-10-CM | POA: Diagnosis not present

## 2021-09-02 DIAGNOSIS — N3946 Mixed incontinence: Secondary | ICD-10-CM | POA: Insufficient documentation

## 2021-09-02 DIAGNOSIS — Z79890 Hormone replacement therapy (postmenopausal): Secondary | ICD-10-CM | POA: Insufficient documentation

## 2021-09-02 DIAGNOSIS — N3281 Overactive bladder: Secondary | ICD-10-CM | POA: Insufficient documentation

## 2021-09-02 DIAGNOSIS — R3129 Other microscopic hematuria: Secondary | ICD-10-CM | POA: Diagnosis not present

## 2021-09-02 DIAGNOSIS — I10 Essential (primary) hypertension: Secondary | ICD-10-CM | POA: Insufficient documentation

## 2021-09-02 DIAGNOSIS — E039 Hypothyroidism, unspecified: Secondary | ICD-10-CM | POA: Diagnosis not present

## 2021-09-02 DIAGNOSIS — Z79899 Other long term (current) drug therapy: Secondary | ICD-10-CM | POA: Diagnosis not present

## 2021-09-02 DIAGNOSIS — D494 Neoplasm of unspecified behavior of bladder: Secondary | ICD-10-CM | POA: Diagnosis present

## 2021-09-02 HISTORY — PX: TRANSURETHRAL RESECTION OF BLADDER TUMOR WITH MITOMYCIN-C: SHX6459

## 2021-09-02 SURGERY — TRANSURETHRAL RESECTION OF BLADDER TUMOR WITH MITOMYCIN-C
Anesthesia: General

## 2021-09-02 MED ORDER — PROPOFOL 10 MG/ML IV BOLUS
INTRAVENOUS | Status: AC
  Filled 2021-09-02: qty 20

## 2021-09-02 MED ORDER — EPHEDRINE SULFATE-NACL 50-0.9 MG/10ML-% IV SOSY
PREFILLED_SYRINGE | INTRAVENOUS | Status: DC | PRN
  Administered 2021-09-02: 10 mg via INTRAVENOUS

## 2021-09-02 MED ORDER — OXYCODONE HCL 5 MG PO TABS
ORAL_TABLET | ORAL | Status: AC
  Administered 2021-09-02: 5 mg via ORAL
  Filled 2021-09-02: qty 1

## 2021-09-02 MED ORDER — OXYCODONE HCL 5 MG/5ML PO SOLN: 5.0000 mg | Freq: Once | ORAL | Status: AC | PRN

## 2021-09-02 MED ORDER — FENTANYL CITRATE (PF) 100 MCG/2ML IJ SOLN
INTRAMUSCULAR | Status: DC | PRN
  Administered 2021-09-02 (×2): 25 ug via INTRAVENOUS

## 2021-09-02 MED ORDER — CEFAZOLIN SODIUM-DEXTROSE 2-4 GM/100ML-% IV SOLN
INTRAVENOUS | Status: AC
  Filled 2021-09-02: qty 100

## 2021-09-02 MED ORDER — FENTANYL CITRATE (PF) 100 MCG/2ML IJ SOLN
INTRAMUSCULAR | Status: AC
  Filled 2021-09-02: qty 2

## 2021-09-02 MED ORDER — PHENYLEPHRINE 40 MCG/ML (10ML) SYRINGE FOR IV PUSH (FOR BLOOD PRESSURE SUPPORT)
PREFILLED_SYRINGE | INTRAVENOUS | Status: DC | PRN
  Administered 2021-09-02: 120 ug via INTRAVENOUS

## 2021-09-02 MED ORDER — LIDOCAINE 2% (20 MG/ML) 5 ML SYRINGE
INTRAMUSCULAR | Status: DC | PRN
  Administered 2021-09-02: 100 mg via INTRAVENOUS

## 2021-09-02 MED ORDER — ONDANSETRON HCL 4 MG/2ML IJ SOLN
INTRAMUSCULAR | Status: AC
  Filled 2021-09-02: qty 2

## 2021-09-02 MED ORDER — DEXAMETHASONE SODIUM PHOSPHATE 10 MG/ML IJ SOLN
INTRAMUSCULAR | Status: DC | PRN
  Administered 2021-09-02: 10 mg via INTRAVENOUS

## 2021-09-02 MED ORDER — STERILE WATER FOR IRRIGATION IR SOLN
Status: DC | PRN
  Administered 2021-09-02: 500 mL

## 2021-09-02 MED ORDER — MIDAZOLAM HCL 2 MG/2ML IJ SOLN
INTRAMUSCULAR | Status: AC
  Filled 2021-09-02: qty 2

## 2021-09-02 MED ORDER — LIDOCAINE HCL (PF) 2 % IJ SOLN
INTRAMUSCULAR | Status: AC
  Filled 2021-09-02: qty 5

## 2021-09-02 MED ORDER — GEMCITABINE CHEMO FOR BLADDER INSTILLATION 2000 MG
2000.0000 mg | Freq: Once | INTRAVENOUS | Status: AC
  Administered 2021-09-02: 2000 mg via INTRAVESICAL
  Filled 2021-09-02: qty 2000

## 2021-09-02 MED ORDER — LACTATED RINGERS IV SOLN: INTRAVENOUS | Status: DC

## 2021-09-02 MED ORDER — ONDANSETRON HCL 4 MG/2ML IJ SOLN
INTRAMUSCULAR | Status: DC | PRN
  Administered 2021-09-02: 4 mg via INTRAVENOUS

## 2021-09-02 MED ORDER — SODIUM CHLORIDE 0.9 % IR SOLN
Status: DC | PRN
  Administered 2021-09-02: 6000 mL via INTRAVESICAL

## 2021-09-02 MED ORDER — ONDANSETRON HCL 4 MG/2ML IJ SOLN: 4.0000 mg | Freq: Once | INTRAMUSCULAR | Status: DC | PRN

## 2021-09-02 MED ORDER — DEXAMETHASONE SODIUM PHOSPHATE 10 MG/ML IJ SOLN
INTRAMUSCULAR | Status: AC
  Filled 2021-09-02: qty 1

## 2021-09-02 MED ORDER — PROPOFOL 10 MG/ML IV BOLUS
INTRAVENOUS | Status: DC | PRN
  Administered 2021-09-02: 200 mg via INTRAVENOUS

## 2021-09-02 MED ORDER — CEFAZOLIN SODIUM-DEXTROSE 2-4 GM/100ML-% IV SOLN
2.0000 g | INTRAVENOUS | Status: AC
  Administered 2021-09-02: 2 g via INTRAVENOUS

## 2021-09-02 MED ORDER — HYDROCODONE-ACETAMINOPHEN 5-325 MG PO TABS: 1.0000 | ORAL_TABLET | ORAL | 0 refills | Status: DC | PRN

## 2021-09-02 MED ORDER — KETOROLAC TROMETHAMINE 30 MG/ML IJ SOLN: 30.0000 mg | Freq: Once | INTRAMUSCULAR | Status: DC | PRN

## 2021-09-02 MED ORDER — CHLORHEXIDINE GLUCONATE 0.12 % MT SOLN: 15.0000 mL | Freq: Once | OROMUCOSAL | Status: AC

## 2021-09-02 MED ORDER — ORAL CARE MOUTH RINSE
15.0000 mL | Freq: Once | OROMUCOSAL | Status: AC
  Administered 2021-09-02: 15 mL via OROMUCOSAL

## 2021-09-02 MED ORDER — HYDROMORPHONE HCL 1 MG/ML IJ SOLN: 0.2500 mg | INTRAMUSCULAR | Status: DC | PRN

## 2021-09-02 MED ORDER — OXYCODONE HCL 5 MG PO TABS: 5.0000 mg | ORAL_TABLET | Freq: Once | ORAL | Status: AC | PRN

## 2021-09-02 SURGICAL SUPPLY — 18 items
BAG DRN RND TRDRP ANRFLXCHMBR (UROLOGICAL SUPPLIES) ×1
BAG URINE DRAIN 2000ML AR STRL (UROLOGICAL SUPPLIES) ×1 IMPLANT
BAG URO CATCHER STRL LF (MISCELLANEOUS) ×2 IMPLANT
CATH FOLEY 2WAY SLVR  5CC 18FR (CATHETERS) ×2
CATH FOLEY 2WAY SLVR 5CC 18FR (CATHETERS) IMPLANT
DRAPE FOOT SWITCH (DRAPES) ×2 IMPLANT
ELECT REM PT RETURN 15FT ADLT (MISCELLANEOUS) ×1 IMPLANT
GLOVE SURG ENC MOIS LTX SZ7.5 (GLOVE) ×2 IMPLANT
GOWN STRL REUS W/TWL XL LVL3 (GOWN DISPOSABLE) ×2 IMPLANT
KIT TURNOVER KIT A (KITS) ×2 IMPLANT
LOOP CUT BIPOLAR 24F LRG (ELECTROSURGICAL) ×1 IMPLANT
MANIFOLD NEPTUNE II (INSTRUMENTS) ×2 IMPLANT
PACK CYSTO (CUSTOM PROCEDURE TRAY) ×2 IMPLANT
PLUG CATH AND CAP STER (CATHETERS) ×1 IMPLANT
SYR TOOMEY IRRIG 70ML (MISCELLANEOUS)
SYRINGE TOOMEY IRRIG 70ML (MISCELLANEOUS) IMPLANT
TUBING CONNECTING 10 (TUBING) ×2 IMPLANT
TUBING UROLOGY SET (TUBING) ×2 IMPLANT

## 2021-09-02 NOTE — H&P (Signed)
CC/HPI: Referral from Dr. Matilde Sprang who followed the patient for mixed urinary incontinence. She was found to have microscopic hematuria. CT hematuria protocol revealed a filling defect in the bladder. Cystoscopy confirmed a small bladder tumor next to the left ureteral vesicular junction and a possible tumor at 4 o'clock. Patient denies gross hematuria. She has no complaints today.   05/02/2020  Patient is status post TURBT with instillation of gemcitabine on 04/18/2020. Pathology revealed low-grade urothelial cell carcinoma, noninvasive. Tumor resection was along the level of the trigone and she has had increased urinary frequency since the procedure which is not unexpected. Urinalysis is not consistent with infection. She denies any hematuria or dysuria. She continues on Myrbetriq for overactive bladder and incontinence. Other than her frequency, she feels like her voiding symptoms are stable. Denies incontinence.   08/02/2020  Patient presents today for surveillance cystoscopy   11/08/2020  Hx of LG Ta UCB s/p TURBT 04/18/2020. Neg 17mo cysto evaluation on 08/02/2020. Neg cysto on 11/08/2020. No voiding complaints. No GH or dysuria.   08/14/2021  Patient presents today for surveillance cystoscopy. Urinalysis negative for hematuria. She denies any interval gross hematuria or dysuria. She is no longer on any medications for urinary frequency. She is no longer having those symptoms.     ALLERGIES: None   MEDICATIONS: Atenolol 100 mg tablet  Atorvastatin Calcium 20 mg tablet  Cytomel 5 mcg tablet  Sertraline Hcl 100 mg tablet  Synthroid 100 mcg tablet  Ubrelvy 100 mg tablet     GU PSH: Bladder Instill AntiCA Agent - 04/18/2020 Cystoscopy - 11/08/2020, 08/02/2020, 03/21/2020 Cystoscopy TURBT 2-5 cm - 04/18/2020 Locm 300-399Mg /Ml Iodine,1Ml - 03/14/2020     NON-GU PSH: None   GU PMH: Bladder Cancer overlapping sites - 11/08/2020, - 05/02/2020 Bladder tumor/neoplasm - 04/03/2020 Microscopic hematuria  - 2021 Mixed incontinence - 2021 Urinary Frequency - 2021    NON-GU PMH: None   FAMILY HISTORY: None   SOCIAL HISTORY: Marital Status: Married    REVIEW OF SYSTEMS:    GU Review Female:   Patient denies frequent urination, hard to postpone urination, burning /pain with urination, get up at night to urinate, leakage of urine, stream starts and stops, trouble starting your stream, have to strain to urinate, and being pregnant.  Gastrointestinal (Upper):   Patient denies nausea, vomiting, and indigestion/ heartburn.  Gastrointestinal (Lower):   Patient denies diarrhea and constipation.  Constitutional:   Patient denies fever, night sweats, weight loss, and fatigue.  Skin:   Patient denies skin rash/ lesion and itching.  Eyes:   Patient denies blurred vision and double vision.  Ears/ Nose/ Throat:   Patient denies sore throat and sinus problems.  Hematologic/Lymphatic:   Patient denies swollen glands and easy bruising.  Cardiovascular:   Patient denies chest pains and leg swelling.  Respiratory:   Patient denies cough and shortness of breath.  Endocrine:   Patient denies excessive thirst.  Musculoskeletal:   Patient denies back pain and joint pain.  Neurological:   Patient denies headaches and dizziness.  Psychologic:   Patient denies depression and anxiety.   VITAL SIGNS: None   GU PHYSICAL EXAMINATION:    Vagina: No atrophy, no stenosis. No rectocele. No cystocele. No enterocele.   MULTI-SYSTEM PHYSICAL EXAMINATION:    NAD Abd s/nt/nd    Complexity of Data:  Source Of History:  Patient  Records Review:   Previous Patient Records  Urine Test Review:   Urinalysis   PROCEDURES:  Flexible Cystoscopy - 52000  Risks, benefits, and the potential complications of the procedure were discussed with the patient including infection, bleeding, voiding discomfort, urinary retention, etc. All questions were answered. Consent was obtained. Sterile technique and intraurethral  analgesia were used.  Meatus:  Normal size. Normal location. Normal condition.  Urethra:  Normal urethra  Ureteral Orifices:  Normal location. Normal size. Normal shape.   Bladder:  No trabeculation. Small about 2 cm bladder tumor just to the left of the trigone. Looks like it is just beyond the ureteral orifice.      The procedure was well-tolerated and without complications. Instructions were given to call the office if she developed any problems. The patient stated that she understood these instructions.         Urinalysis w/Scope Dipstick Dipstick Cont'd Micro  Color: Yellow Bilirubin: Neg mg/dL WBC/hpf: 6 - 10/hpf  Appearance: Slightly Cloudy Ketones: Neg mg/dL RBC/hpf: 0 - 2/hpf  Specific Gravity: 1.015 Blood: Neg ery/uL Bacteria: Few (10-25/hpf)  pH: 6.5 Protein: Trace mg/dL Cystals: NS (Not Seen)  Glucose: Neg mg/dL Urobilinogen: 0.2 mg/dL Casts: NS (Not Seen)    Nitrites: Neg Trichomonas: Not Present    Leukocyte Esterase: 2+ leu/uL Mucous: Not Present      Epithelial Cells: 0 - 5/hpf      Yeast: NS (Not Seen)      Sperm: Not Present    ASSESSMENT:      ICD-10 Details  1 GU:   Bladder Cancer overlapping sites - C67.8 Chronic, Worsening   PLAN:           Orders Labs Urine Culture          Document Letter(s):  Created for Patient: Clinical Summary         Notes:   Plan for transurethral resection of bladder tumor with instillation of gemcitabine. Risks and benefits discussed.   CC: Dr. Brigitte Pulse   Signed by Link Snuffer, III, M.D. on 08/14/21 at 2:00 PM (EDT

## 2021-09-02 NOTE — Anesthesia Procedure Notes (Signed)
Procedure Name: LMA Insertion Date/Time: 09/02/2021 4:01 PM Performed by: Sharlette Dense, CRNA Patient Re-evaluated:Patient Re-evaluated prior to induction Oxygen Delivery Method: Circle system utilized Preoxygenation: Pre-oxygenation with 100% oxygen Induction Type: IV induction LMA: LMA inserted LMA Size: 4.0 Number of attempts: 1 Placement Confirmation: positive ETCO2 and breath sounds checked- equal and bilateral Tube secured with: Tape Dental Injury: Teeth and Oropharynx as per pre-operative assessment

## 2021-09-02 NOTE — Anesthesia Postprocedure Evaluation (Signed)
Anesthesia Post Note  Patient: Carla Rojas  Procedure(s) Performed: TRANSURETHRAL RESECTION OF BLADDER TUMOR WITH GEMCITABINE     Patient location during evaluation: PACU Anesthesia Type: General Level of consciousness: awake and alert Pain management: pain level controlled Vital Signs Assessment: post-procedure vital signs reviewed and stable Respiratory status: spontaneous breathing, nonlabored ventilation, respiratory function stable and patient connected to nasal cannula oxygen Cardiovascular status: blood pressure returned to baseline and stable Postop Assessment: no apparent nausea or vomiting Anesthetic complications: no   No notable events documented.  Last Vitals:  Vitals:   09/02/21 1700 09/02/21 1715  BP: 121/79 120/81  Pulse: 64 63  Resp: 14 12  Temp:    SpO2: 94% 95%    Last Pain:  Vitals:   09/02/21 1715  TempSrc:   PainSc: 0-No pain                 Eldene Plocher S

## 2021-09-02 NOTE — Transfer of Care (Signed)
Immediate Anesthesia Transfer of Care Note  Patient: Carla Rojas  Procedure(s) Performed: TRANSURETHRAL RESECTION OF BLADDER TUMOR WITH GEMCITABINE  Patient Location: PACU  Anesthesia Type:General  Level of Consciousness: awake and alert   Airway & Oxygen Therapy: Patient Spontanous Breathing and Patient connected to face mask oxygen  Post-op Assessment: Report given to RN and Post -op Vital signs reviewed and stable  Post vital signs: Reviewed and stable  Last Vitals:  Vitals Value Taken Time  BP 110/67 09/02/21 1634  Temp    Pulse 71 09/02/21 1635  Resp    SpO2 100 % 09/02/21 1635  Vitals shown include unvalidated device data.  Last Pain:  Vitals:   09/02/21 1458  TempSrc: Oral         Complications: No notable events documented.

## 2021-09-02 NOTE — Discharge Instructions (Addendum)

## 2021-09-02 NOTE — Anesthesia Preprocedure Evaluation (Signed)
Anesthesia Evaluation  Patient identified by MRN, date of birth, ID band Patient awake    Reviewed: Allergy & Precautions, NPO status , Patient's Chart, lab work & pertinent test results  Airway Mallampati: II  TM Distance: >3 FB Neck ROM: Full    Dental no notable dental hx.    Pulmonary neg pulmonary ROS,    Pulmonary exam normal breath sounds clear to auscultation       Cardiovascular hypertension, Pt. on medications and Pt. on home beta blockers Normal cardiovascular exam Rhythm:Regular Rate:Normal     Neuro/Psych negative neurological ROS  negative psych ROS   GI/Hepatic Neg liver ROS, GERD  ,  Endo/Other  Hypothyroidism   Renal/GU negative Renal ROS  negative genitourinary   Musculoskeletal negative musculoskeletal ROS (+)   Abdominal   Peds negative pediatric ROS (+)  Hematology negative hematology ROS (+)   Anesthesia Other Findings   Reproductive/Obstetrics negative OB ROS                             Anesthesia Physical Anesthesia Plan  ASA: 2  Anesthesia Plan: General   Post-op Pain Management:    Induction: Intravenous  PONV Risk Score and Plan: 3 and Ondansetron, Dexamethasone and Treatment may vary due to age or medical condition  Airway Management Planned: Oral ETT  Additional Equipment:   Intra-op Plan:   Post-operative Plan: Extubation in OR  Informed Consent: I have reviewed the patients History and Physical, chart, labs and discussed the procedure including the risks, benefits and alternatives for the proposed anesthesia with the patient or authorized representative who has indicated his/her understanding and acceptance.     Dental advisory given  Plan Discussed with: CRNA and Surgeon  Anesthesia Plan Comments:         Anesthesia Quick Evaluation

## 2021-09-02 NOTE — Op Note (Signed)
Operative Note  Preoperative diagnosis:  1.  Bladder tumor  Postoperative diagnosis: 1.  Bladder tumor--small  Procedure(s): 1.  Transurethral resection of bladder tumor--small 2.  Intravesical instillation of gemcitabine  Surgeon: Link Snuffer, MD  Assistants: None  Anesthesia: General  Complications: None immediate  EBL: Minimal  Specimens: 1.  Bladder tumor  Drains/Catheters: 1.  18 French Foley catheter  Intraoperative findings: 1.  Normal urethra 2.  1 cm bladder tumor just cephalad to the left ureteral orifice that was completely resected.  The ureteral orifice was away from resection.  There was a subcentimeter tiny papillary tumor in the posterior bladder that was completely resected.  No other tumors.  Indication: 60 year old female with a history of low-grade urothelial cell carcinoma presents with a recurrence and presents for resection.  Description of procedure:  The patient was identified and consent was obtained.  The patient was taken to the operating room and placed in the supine position.  The patient was placed under general anesthesia.  Perioperative antibiotics were administered.  The patient was placed in dorsal lithotomy.  Patient was prepped and draped in a standard sterile fashion and a timeout was performed.  A 26 French resectoscope with a visual obturator in place was advanced into the urethra and into the bladder.  This was exchanged for the bipolar working element.  Complete cystoscopy was performed with findings noted above.  The tumors of interest were resected on bipolar settings.  Resection beds were fulgurated.  Specimen was collected.  Entire bladder was reinspected and no other tumors remained.  There was no evidence of perforation.  No active bleeding.  I withdrew the scope and placed an 51 French Foley catheter.  This include the operation.  Patient tolerated the procedure well and stable postoperative.  In the PACU, I instilled gemcitabine  into the bladder where it remained for approximately 1 hour prior to proper disposal.  Plan: Follow-up in 1 week for pathology review.

## 2021-09-03 ENCOUNTER — Encounter (HOSPITAL_COMMUNITY): Payer: Self-pay | Admitting: Urology

## 2021-09-04 LAB — SURGICAL PATHOLOGY

## 2022-09-09 ENCOUNTER — Other Ambulatory Visit: Payer: Self-pay | Admitting: Family Medicine

## 2022-09-09 DIAGNOSIS — Z1231 Encounter for screening mammogram for malignant neoplasm of breast: Secondary | ICD-10-CM

## 2022-10-09 ENCOUNTER — Ambulatory Visit
Admission: RE | Admit: 2022-10-09 | Discharge: 2022-10-09 | Disposition: A | Discharge status: Home / Self Care | Payer: BC Managed Care – PPO | Source: Ambulatory Visit | Attending: Family Medicine | Admitting: Family Medicine

## 2022-10-09 DIAGNOSIS — Z1231 Encounter for screening mammogram for malignant neoplasm of breast: Secondary | ICD-10-CM

## 2022-12-25 ENCOUNTER — Other Ambulatory Visit: Payer: Self-pay | Admitting: Family Medicine

## 2022-12-25 DIAGNOSIS — R229 Localized swelling, mass and lump, unspecified: Secondary | ICD-10-CM

## 2023-01-14 ENCOUNTER — Ambulatory Visit
Admission: RE | Admit: 2023-01-14 | Discharge: 2023-01-14 | Disposition: A | Discharge status: Home / Self Care | Payer: BC Managed Care – PPO | Source: Ambulatory Visit | Attending: Family Medicine | Admitting: Family Medicine

## 2023-01-14 DIAGNOSIS — R229 Localized swelling, mass and lump, unspecified: Secondary | ICD-10-CM

## 2023-03-05 ENCOUNTER — Other Ambulatory Visit: Payer: Self-pay | Admitting: General Surgery

## 2023-09-21 ENCOUNTER — Other Ambulatory Visit: Payer: Self-pay | Admitting: Urology

## 2023-10-01 NOTE — Patient Instructions (Addendum)
SURGICAL WAITING ROOM VISITATION  Patients having surgery or a procedure may have no more than 2 support people in the waiting area - these visitors may rotate.    Children under the age of 22 must have an adult with them who is not the patient.  Due to an increase in RSV and influenza rates and associated hospitalizations, children ages 106 and under may not visit patients in [facility] hospitals.  If the patient needs to stay at the hospital during part of their recovery, the visitor guidelines for inpatient rooms apply. Pre-op nurse will coordinate an appropriate time for 1 support person to accompany patient in pre-op.  This support person may not rotate.    Please refer to the [facility] website for the visitor guidelines for Inpatients (after your surgery is over and you are in a regular room).    Your procedure is scheduled on: 10/12/23   Report to [facility] Main Entrance    Report to admitting at 10:15 AM   Call this number if you have problems the morning of surgery [phone]   Do not eat food or drink liquids :After Midnight.          If you have questions, please contact your surgeon's office.   FOLLOW BOWEL PREP AND ANY ADDITIONAL PRE OP INSTRUCTIONS YOU RECEIVED FROM YOUR SURGEON'S OFFICE!!!     Oral Hygiene is also important to reduce your risk of infection.                                    Remember - BRUSH YOUR TEETH THE MORNING OF SURGERY WITH YOUR REGULAR TOOTHPASTE  DENTURES WILL BE REMOVED PRIOR TO SURGERY PLEASE DO NOT APPLY "Poly grip" OR ADHESIVES!!!   Stop all vitamins and herbal supplements 7 days before surgery.   Take these medicines the morning of surgery with A SIP OF WATER: Atenolol, Atorvastatin, Bupropion, Levothyroxine   Bring CPAP mask and tubing day of surgery.                              You may not have any metal on your body including hair pins, jewelry, and body piercing             Do not wear make-up, lotions, powders,  perfumes, or deodorant  Do not wear nail polish including gel and S&S, artificial/acrylic nails, or any other type of covering on natural nails including finger and toenails. If you have artificial nails, gel coating, etc. that needs to be removed by a nail salon please have this removed prior to surgery or surgery may need to be canceled/ delayed if the surgeon/ anesthesia feels like they are unable to be safely monitored.   Do not shave  48 hours prior to surgery.    Do not bring valuables to the hospital. [facility] IS NOT             RESPONSIBLE   FOR VALUABLES.   Contacts, glasses, dentures or bridgework may not be worn into surgery.  DO NOT BRING YOUR HOME MEDICATIONS TO THE HOSPITAL. PHARMACY WILL DISPENSE MEDICATIONS LISTED ON YOUR MEDICATION LIST TO YOU DURING YOUR ADMISSION IN THE HOSPITAL!    Patients discharged on the day of surgery will not be allowed to drive home.  Someone NEEDS to stay with you for the first 24 hours  after anesthesia.              Please read over the following fact sheets you were given: IF YOU HAVE QUESTIONS ABOUT YOUR PRE-OP INSTRUCTIONS PLEASE CALL [phone] Contras    If you received a COVID test during your pre-op visit  it is requested that you wear a mask when out in public, stay away from anyone that may not be feeling well and notify your surgeon if you develop symptoms. If you test positive for Covid or have been in contact with anyone that has tested positive in the last 10 days please notify you surgeon.    [facility] - Preparing for Surgery Before surgery, you can play an important role.  Because skin is not sterile, your skin needs to be as free of germs as possible.  You can reduce the number of germs on your skin by washing with CHG (chlorahexidine gluconate) soap before surgery.  CHG is an antiseptic cleaner which kills germs and bonds with the skin to continue killing germs even after washing. Please DO NOT use if you have an allergy to  CHG or antibacterial soaps.  If your skin becomes reddened/irritated stop using the CHG and inform your nurse when you arrive at Short Stay. Do not shave (including legs and underarms) for at least 48 hours prior to the first CHG shower.  You may shave your face/neck.  Please follow these instructions carefully:  1.  Shower with CHG Soap the night before surgery and the  morning of surgery.  2.  If you choose to wash your hair, wash your hair first as usual with your normal  shampoo.  3.  After you shampoo, rinse your hair and body thoroughly to remove the shampoo.                             4.  Use CHG as you would any other liquid soap.  You can apply chg directly to the skin and wash.  Gently with a scrungie or clean washcloth.  5.  Apply the CHG Soap to your body ONLY FROM THE NECK DOWN.   Do   not use on face/ open                           Wound or open sores. Avoid contact with eyes, ears mouth and   genitals (private parts).                       Wash face,  Genitals (private parts) with your normal soap.             6.  Wash thoroughly, paying special attention to the area where your    surgery  will be performed.  7.  Thoroughly rinse your body with warm water from the neck down.  8.  DO NOT shower/wash with your normal soap after using and rinsing off the CHG Soap.                9.  Pat yourself dry with a clean towel.            10.  Wear clean pajamas.            11.  Place clean sheets on your bed the night of your first shower and do not  sleep with pets. Day of Surgery : Do not  apply any lotions/deodorants the morning of surgery.  Please wear clean clothes to the hospital/surgery center.  FAILURE TO FOLLOW THESE INSTRUCTIONS MAY RESULT IN THE CANCELLATION OF YOUR SURGERY  PATIENT SIGNATURE_________________________________  NURSE SIGNATURE__________________________________  ________________________________________________________________________

## 2023-10-01 NOTE — Progress Notes (Addendum)
COVID Vaccine Completed: yes  Date of COVID positive in last 90 days: no  PCP - Lupita Raider, MD Cardiologist - n/a  Chest x-ray - n/a EKG - 10/05/23 Epic/chart Stress Test - n/a ECHO - n/a Cardiac Cath - n/a Pacemaker/ICD device last checked: n/a Spinal Cord Stimulator: n/a  Bowel Prep - no  Sleep Study - yes CPAP - yes every night  Fasting Blood Sugar - n/a Checks Blood Sugar _____ times a day  Last dose of GLP1 agonist-  N/A GLP1 instructions:  N/A   Last dose of SGLT-2 inhibitors-  N/A SGLT-2 instructions: N/A   Blood Thinner Instructions: n/a Aspirin Instructions: Last Dose:  Activity level: Can go up a flight of stairs and perform activities of daily living without stopping and without symptoms of chest pain or shortness of breath.   Anesthesia review:   Patient denies shortness of breath, fever, cough and chest pain at PAT appointment  Patient verbalized understanding of instructions that were given to them at the PAT appointment. Patient was also instructed that they will need to review over the PAT instructions again at home before surgery.

## 2023-10-05 ENCOUNTER — Other Ambulatory Visit: Payer: Self-pay

## 2023-10-05 ENCOUNTER — Encounter (HOSPITAL_COMMUNITY): Payer: Self-pay

## 2023-10-05 ENCOUNTER — Encounter (HOSPITAL_COMMUNITY)
Admission: RE | Admit: 2023-10-05 | Discharge: 2023-10-05 | Disposition: A | Discharge status: Home / Self Care | Payer: BC Managed Care – PPO | Source: Ambulatory Visit | Attending: Urology | Admitting: Urology

## 2023-10-05 VITALS — BP 135/87 | HR 66 | Temp 99.1°F | Resp 14 | Ht 66.0 in | Wt 205.0 lb

## 2023-10-05 DIAGNOSIS — I10 Essential (primary) hypertension: Secondary | ICD-10-CM | POA: Diagnosis not present

## 2023-10-05 DIAGNOSIS — Z01818 Encounter for other preprocedural examination: Secondary | ICD-10-CM | POA: Diagnosis present

## 2023-10-05 LAB — BASIC METABOLIC PANEL
Anion gap: 10 (ref 5–15)
BUN: 13 mg/dL (ref 6–20)
CO2: 23 mmol/L (ref 22–32)
Calcium: 8.7 mg/dL — ABNORMAL LOW (ref 8.9–10.3)
Chloride: 105 mmol/L (ref 98–111)
Creatinine, Ser: 0.72 mg/dL (ref 0.44–1.00)
GFR, Estimated: >60 mL/min (ref >60)
Glucose, Bld: 126 mg/dL — ABNORMAL HIGH (ref 70–99)
Potassium: 3.9 mmol/L (ref 3.5–5.1)
Sodium: 138 mmol/L (ref 135–145)

## 2023-10-05 LAB — CBC
HCT: 41.2 % (ref 36.0–46.0)
Hemoglobin: 13.2 g/dL (ref 12.0–15.0)
MCH: 28.8 pg (ref 26.0–34.0)
MCHC: 32 g/dL (ref 30.0–36.0)
MCV: 89.8 fL (ref 80.0–100.0)
Platelets: 308 10*3/uL (ref 150–400)
RBC: 4.59 MIL/uL (ref 3.87–5.11)
RDW: 13.1 % (ref 11.5–15.5)
WBC: 7.4 10*3/uL (ref 4.0–10.5)
nRBC: 0 % (ref 0.0–0.2)

## 2023-10-07 ENCOUNTER — Other Ambulatory Visit: Payer: Self-pay | Admitting: Family Medicine

## 2023-10-07 DIAGNOSIS — Z0000 Encounter for general adult medical examination without abnormal findings: Secondary | ICD-10-CM

## 2023-10-09 ENCOUNTER — Ambulatory Visit
Admission: RE | Admit: 2023-10-09 | Discharge: 2023-10-09 | Disposition: A | Discharge status: Home / Self Care | Payer: BC Managed Care – PPO | Source: Ambulatory Visit | Attending: Family Medicine | Admitting: Family Medicine

## 2023-10-09 DIAGNOSIS — Z0000 Encounter for general adult medical examination without abnormal findings: Secondary | ICD-10-CM

## 2023-10-12 ENCOUNTER — Ambulatory Visit (HOSPITAL_COMMUNITY): Payer: BC Managed Care – PPO | Admitting: Anesthesiology

## 2023-10-12 ENCOUNTER — Ambulatory Visit (HOSPITAL_COMMUNITY)
Admission: RE | Admit: 2023-10-12 | Discharge: 2023-10-12 | Disposition: A | Discharge status: Home / Self Care | Payer: BC Managed Care – PPO | Attending: Urology | Admitting: Urology

## 2023-10-12 ENCOUNTER — Encounter (HOSPITAL_COMMUNITY)
Admission: RE | Disposition: A | Discharge status: Home / Self Care | Payer: Self-pay | Source: Home / Self Care | Attending: Urology

## 2023-10-12 ENCOUNTER — Ambulatory Visit (HOSPITAL_COMMUNITY): Payer: BC Managed Care – PPO

## 2023-10-12 ENCOUNTER — Encounter (HOSPITAL_COMMUNITY): Payer: Self-pay | Admitting: Urology

## 2023-10-12 ENCOUNTER — Other Ambulatory Visit: Payer: Self-pay

## 2023-10-12 DIAGNOSIS — Z8551 Personal history of malignant neoplasm of bladder: Secondary | ICD-10-CM | POA: Insufficient documentation

## 2023-10-12 DIAGNOSIS — N3946 Mixed incontinence: Secondary | ICD-10-CM | POA: Insufficient documentation

## 2023-10-12 DIAGNOSIS — I10 Essential (primary) hypertension: Secondary | ICD-10-CM | POA: Diagnosis not present

## 2023-10-12 DIAGNOSIS — K219 Gastro-esophageal reflux disease without esophagitis: Secondary | ICD-10-CM | POA: Diagnosis not present

## 2023-10-12 DIAGNOSIS — D494 Neoplasm of unspecified behavior of bladder: Secondary | ICD-10-CM | POA: Insufficient documentation

## 2023-10-12 DIAGNOSIS — N3281 Overactive bladder: Secondary | ICD-10-CM | POA: Insufficient documentation

## 2023-10-12 HISTORY — PX: CYSTOSCOPY W/ RETROGRADES: SHX1426

## 2023-10-12 SURGERY — TURBT, WITH CHEMOTHERAPEUTIC AGENT INSTILLATION INTO BLADDER
Anesthesia: General

## 2023-10-12 MED ORDER — CIPROFLOXACIN IN D5W 400 MG/200ML IV SOLN
400.0000 mg | INTRAVENOUS | Status: AC
Start: 2023-10-12 — End: 2023-10-12
  Administered 2023-10-12: 400 mg via INTRAVENOUS
  Filled 2023-10-12: qty 200

## 2023-10-12 MED ORDER — EPHEDRINE 5 MG/ML INJ
INTRAVENOUS | Status: AC
  Filled 2023-10-12: qty 5

## 2023-10-12 MED ORDER — CHLORHEXIDINE GLUCONATE 0.12 % MT SOLN
15.0000 mL | Freq: Once | OROMUCOSAL | Status: AC
  Administered 2023-10-12: 15 mL via OROMUCOSAL

## 2023-10-12 MED ORDER — IOHEXOL 300 MG/ML  SOLN
INTRAMUSCULAR | Status: DC | PRN
  Administered 2023-10-12: 9 mL via URETHRAL

## 2023-10-12 MED ORDER — OXYCODONE HCL 5 MG/5ML PO SOLN: 5.0000 mg | Freq: Once | ORAL | Status: DC | PRN

## 2023-10-12 MED ORDER — FENTANYL CITRATE PF 50 MCG/ML IJ SOSY: 25.0000 ug | PREFILLED_SYRINGE | INTRAMUSCULAR | Status: DC | PRN

## 2023-10-12 MED ORDER — ONDANSETRON HCL 4 MG/2ML IJ SOLN
INTRAMUSCULAR | Status: DC | PRN
  Administered 2023-10-12: 4 mg via INTRAVENOUS

## 2023-10-12 MED ORDER — DEXAMETHASONE SODIUM PHOSPHATE 10 MG/ML IJ SOLN
INTRAMUSCULAR | Status: AC
  Filled 2023-10-12: qty 1

## 2023-10-12 MED ORDER — EPHEDRINE SULFATE (PRESSORS) 50 MG/ML IJ SOLN
INTRAMUSCULAR | Status: DC | PRN
  Administered 2023-10-12 (×2): 7.5 mg via INTRAVENOUS

## 2023-10-12 MED ORDER — FENTANYL CITRATE (PF) 100 MCG/2ML IJ SOLN
INTRAMUSCULAR | Status: DC | PRN
  Administered 2023-10-12: 25 ug via INTRAVENOUS

## 2023-10-12 MED ORDER — ORAL CARE MOUTH RINSE: 15.0000 mL | Freq: Once | OROMUCOSAL | Status: AC

## 2023-10-12 MED ORDER — DEXAMETHASONE SODIUM PHOSPHATE 10 MG/ML IJ SOLN
INTRAMUSCULAR | Status: DC | PRN
  Administered 2023-10-12: 10 mg via INTRAVENOUS

## 2023-10-12 MED ORDER — OXYCODONE HCL 5 MG PO TABS: 5.0000 mg | ORAL_TABLET | Freq: Once | ORAL | Status: DC | PRN

## 2023-10-12 MED ORDER — ONDANSETRON HCL 4 MG/2ML IJ SOLN
INTRAMUSCULAR | Status: AC
Start: 2023-10-12 — End: ?
  Filled 2023-10-12: qty 2

## 2023-10-12 MED ORDER — LIDOCAINE HCL (PF) 2 % IJ SOLN
INTRAMUSCULAR | Status: AC
  Filled 2023-10-12: qty 5

## 2023-10-12 MED ORDER — MIDAZOLAM HCL 5 MG/5ML IJ SOLN
INTRAMUSCULAR | Status: DC | PRN
  Administered 2023-10-12: 2 mg via INTRAVENOUS

## 2023-10-12 MED ORDER — VASOPRESSIN 20 UNIT/ML IV SOLN
INTRAVENOUS | Status: AC
  Filled 2023-10-12: qty 1

## 2023-10-12 MED ORDER — PROPOFOL 10 MG/ML IV BOLUS
INTRAVENOUS | Status: DC | PRN
  Administered 2023-10-12: 160 mg via INTRAVENOUS

## 2023-10-12 MED ORDER — VASOPRESSIN 20 UNIT/ML IV SOLN
INTRAVENOUS | Status: DC | PRN
  Administered 2023-10-12: .5 [IU] via INTRAVENOUS

## 2023-10-12 MED ORDER — LIDOCAINE HCL (CARDIAC) PF 100 MG/5ML IV SOSY
PREFILLED_SYRINGE | INTRAVENOUS | Status: DC | PRN
  Administered 2023-10-12: 100 mg via INTRAVENOUS

## 2023-10-12 MED ORDER — ACETAMINOPHEN 10 MG/ML IV SOLN: 1000.0000 mg | Freq: Once | INTRAVENOUS | Status: DC | PRN

## 2023-10-12 MED ORDER — FENTANYL CITRATE (PF) 100 MCG/2ML IJ SOLN
INTRAMUSCULAR | Status: AC
  Filled 2023-10-12: qty 2

## 2023-10-12 MED ORDER — LACTATED RINGERS IV SOLN
INTRAVENOUS | Status: DC
Start: 2023-10-12 — End: 2023-10-12

## 2023-10-12 MED ORDER — MIDAZOLAM HCL 2 MG/2ML IJ SOLN
INTRAMUSCULAR | Status: AC
Start: 2023-10-12 — End: ?
  Filled 2023-10-12: qty 2

## 2023-10-12 MED ORDER — GEMCITABINE CHEMO FOR BLADDER INSTILLATION 2000 MG
2000.0000 mg | Freq: Once | INTRAVENOUS | Status: AC
  Administered 2023-10-12: 2000 mg via INTRAVESICAL
  Filled 2023-10-12: qty 2000

## 2023-10-12 MED ORDER — ONDANSETRON HCL 4 MG/2ML IJ SOLN: 4.0000 mg | Freq: Once | INTRAMUSCULAR | Status: DC | PRN

## 2023-10-12 SURGICAL SUPPLY — 10 items
BAG URO CATCHER STRL LF (MISCELLANEOUS) ×2 IMPLANT
CATH URETL OPEN END 6FR 70 (CATHETERS) ×2 IMPLANT
CLOTH BEACON ORANGE TIMEOUT ST (SAFETY) ×2 IMPLANT
GLOVE SURG LX STRL 7.5 STRW (GLOVE) ×2 IMPLANT
GUIDEWIRE STR DUAL SENSOR (WIRE) IMPLANT
KIT TURNOVER KIT A (KITS) IMPLANT
LOOP CUT BIPOLAR 24F LRG (ELECTROSURGICAL) IMPLANT
NS IRRIG 1000ML POUR BTL (IV SOLUTION) IMPLANT
PACK CYSTO (CUSTOM PROCEDURE TRAY) ×2 IMPLANT
TUBING CONNECTING 10 (TUBING) ×2 IMPLANT

## 2023-10-12 NOTE — H&P (Signed)
CC/HPI: Referral from Dr. Sherron Monday who followed the patient for mixed urinary incontinence. She was found to have microscopic hematuria. CT hematuria protocol revealed a filling defect in the bladder. Cystoscopy confirmed a small bladder tumor next to the left ureteral vesicular junction and a possible tumor at 4 o'clock. Patient denies gross hematuria. She has no complaints today.   05/02/2020  Patient is status post TURBT with instillation of gemcitabine on 04/18/2020. Pathology revealed low-grade urothelial cell carcinoma, noninvasive. Tumor resection was along the level of the trigone and she has had increased urinary frequency since the procedure which is not unexpected. Urinalysis is not consistent with infection. She denies any hematuria or dysuria. She continues on Myrbetriq for overactive bladder and incontinence. Other than her frequency, she feels like her voiding symptoms are stable. Denies incontinence.   08/02/2020  Patient presents today for surveillance cystoscopy   11/08/2020  Hx of LG Ta UCB s/p TURBT 04/18/2020. Neg 73mo cysto evaluation on 08/02/2020. Neg cysto on 11/08/2020. No voiding complaints. No GH or dysuria.   08/14/2021  Patient presents today for surveillance cystoscopy. Urinalysis negative for hematuria. She denies any interval gross hematuria or dysuria. She is no longer on any medications for urinary frequency. She is no longer having those symptoms.   09/06/2021  Patient is status post repeat TURBT for a recurrence. This revealed low-grade urothelial cell carcinoma, Noninvasive.   12/06/2021  Patient completed BCG. She did well with this. She states her gynecologist saw some blood in the urine. She has no microscopic hematuria today. She has had no gross hematuria. She presents for surveillance cystoscopy.   03/19/2023  Patient presents for surveillance cystoscopy. She completed her last maintenance BCG.   09/16/2023  Patient presents today for surveillance  cystoscopy. No interval hematuria or dysuria.     ALLERGIES: No Allergies    MEDICATIONS: Amoxicillin  Atenolol 100 mg tablet  Atorvastatin Calcium 20 mg tablet  Cytomel 5 mcg tablet  Meloxicam  Sertraline Hcl 100 mg tablet  Synthroid 100 mcg tablet  Ubrelvy 100 mg tablet     GU PSH: Bladder Instill AntiCA Agent - 01/06/2023, 12/30/2022, 12/23/2022, 04/25/2022, 04/18/2022, 04/11/2022, 10/29/2021, 10/22/2021, 10/15/2021, 10/08/2021, 10/01/2021, 09/24/2021, 2022, 2021 Cystoscopy - 03/19/2023, 12/08/2022, 06/25/2022, 03/21/2022, 2023, 2022, 2021, 2021, 2021 Cystoscopy TURBT <2 cm - 2022 Cystoscopy TURBT 2-5 cm - 2021 Locm 300-399Mg /Ml Iodine,1Ml - 2021     NON-GU PSH: No Non-GU PSH    GU PMH: Bladder Cancer overlapping sites - 03/19/2023, - 01/06/2023, - 12/30/2022, - 12/23/2022, - 12/08/2022, - 06/25/2022, - 04/25/2022, - 04/18/2022, - 04/11/2022, - 03/21/2022, - 2023, - 10/29/2021, - 10/22/2021, - 10/15/2021, - 10/08/2021, - 10/01/2021, - 09/24/2021, - 2022, - 2022, - 2021, - 2021 Bladder tumor/neoplasm - 2021 Microscopic hematuria - 2021 Mixed incontinence - 2021 Urinary Frequency - 2021    NON-GU PMH: No Non-GU PMH    FAMILY HISTORY: No Family History    SOCIAL HISTORY: Marital Status: Married    REVIEW OF SYSTEMS:    GU Review Female:   Patient denies frequent urination, hard to postpone urination, burning /pain with urination, get up at night to urinate, leakage of urine, stream starts and stops, trouble starting your stream, have to strain to urinate, and being pregnant.  Gastrointestinal (Upper):   Patient denies nausea, vomiting, and indigestion/ heartburn.  Gastrointestinal (Lower):   Patient denies diarrhea and constipation.  Constitutional:   Patient denies fever, night sweats, weight loss, and fatigue.  Skin:   Patient denies skin rash/  lesion and itching.  Eyes:   Patient denies blurred vision and double vision.  Ears/ Nose/ Throat:   Patient denies sore throat and sinus problems.   Hematologic/Lymphatic:   Patient denies swollen glands and easy bruising.  Cardiovascular:   Patient denies leg swelling and chest pains.  Respiratory:   Patient denies cough and shortness of breath.  Endocrine:   Patient denies excessive thirst.  Musculoskeletal:   Patient denies back pain and joint pain.  Neurological:   Patient denies headaches and dizziness.  Psychologic:   Patient denies depression and anxiety.   VITAL SIGNS: None   MULTI-SYSTEM PHYSICAL EXAMINATION:    Constitutional: Well-nourished. No physical deformities. Normally developed. Good grooming.  Gastrointestinal: No mass, no tenderness, no rigidity, non obese abdomen.  Eyes: Normal conjunctivae. Normal eyelids.  Musculoskeletal: Normal gait and station of head and neck.     PAST DATA REVIEW: None   PROCEDURES:         Flexible Cystoscopy - 52000  Risks, benefits, and the potential complications of the procedure were discussed with the patient including infection, bleeding, voiding discomfort, urinary retention, etc. All questions were answered. Consent was obtained. Sterile technique and intraurethral analgesia were used.  Meatus:  Normal size. Normal location. Normal condition.  Urethra:  Normal urethra  Ureteral Orifices:  Normal location. Normal size. Normal shape. Evidence of prior resection over the left ureteral orifice. Completely duplicated system.  Bladder:  No trabeculation. Small 1 cm papillary bladder tumor on the anterior bladder wall concerning for recurrence      The procedure was well-tolerated and without complications. Instructions were given to call the office if she developed any problems. The patient stated that she understood these instructions.         Urinalysis w/Scope Dipstick Dipstick Cont'd Micro  Color: Yellow Bilirubin: Neg mg/dL WBC/hpf: 6 - 36/UYQ  Appearance: Slightly Cloudy Ketones: Neg mg/dL RBC/hpf: 3 - 03/KVQ  Specific Gravity: 1.025 Blood: 2+ ery/uL Bacteria: Many  (>50/hpf)  pH: 5.5 Protein: Neg mg/dL Cystals: NS (Not Seen)  Glucose: Neg mg/dL Urobilinogen: 0.2 mg/dL Casts: NS (Not Seen)    Nitrites: Neg Trichomonas: Not Present    Leukocyte Esterase: 1+ leu/uL Mucous: Present      Epithelial Cells: 10 - 20/hpf      Yeast: NS (Not Seen)      Sperm: Not Present    ASSESSMENT:      ICD-10 Details  1 GU:   Bladder Cancer overlapping sites - C67.8 Chronic, Stable   PLAN:           Document Letter(s):  Created for Patient: Clinical Summary         Notes:   Plan for transurethral resection of bladder tumor with instillation of gemcitabine, bilateral retrograde pyelogram. Risk benefits discussed.   CC: Dr. Clelia Croft    Signed by Modena Slater, III, M.D. on 09/16/23 at 2:30 PM (ED

## 2023-10-12 NOTE — Anesthesia Postprocedure Evaluation (Signed)
Anesthesia Post Note  Patient: KHAMYAH STOHR  Procedure(s) Performed: TRANSURETHRAL RESECTION OF BLADDER TUMOR (TURBT) with GEMCITABINE BILATERAL RETROGRADE PYELOGRAM (Bilateral)     Patient location during evaluation: PACU Anesthesia Type: General Level of consciousness: awake and alert Pain management: pain level controlled Vital Signs Assessment: post-procedure vital signs reviewed and stable Respiratory status: spontaneous breathing, nonlabored ventilation, respiratory function stable and patient connected to nasal cannula oxygen Cardiovascular status: blood pressure returned to baseline and stable Postop Assessment: no apparent nausea or vomiting Anesthetic complications: no   No notable events documented.  Last Vitals:  Vitals:   10/12/23 1430 10/12/23 1442  BP: 126/84 (!) 142/96  Pulse: 66 72  Resp: 18 17  Temp:    SpO2: 93% 96%    Last Pain:  Vitals:   10/12/23 1442  TempSrc:   PainSc: 0-No pain                 Mariann Barter

## 2023-10-12 NOTE — Op Note (Signed)
Operative Note  Preoperative diagnosis:  1.  Bladder tumor  Postoperative diagnosis: 1. Bladder tumor--medium  Procedure(s): 1.  Cystoscopy with bilateral retrograde pyelogram 2.  Transurethral resection of bladder tumor--small 3.  Intravesical instillation of gemcitabine  Surgeon: Modena Slater, MD  Assistants: None  Anesthesia: General  Complications: None immediate  EBL: Minimal  Specimens: 1.  Bladder tumor  Drains/Catheters: 1.  Foley catheter  Intraoperative findings: 1.  Normal urethra 2.  Bilateral retrograde pyelogram without any filling defect or hydronephrosis 3.  1 cm anterior bladder wall tumor appeared superficial and low-grade and noninvasive.  This was just to the right of midline.  Completely resected.  Indication: 62 year old female with a history of low-grade urothelial cell carcinoma of the bladder was found to have a likely recurrence on cystoscopy.  She presents for the previously mentioned operation.  Description of procedure:  The patient was identified and consent was obtained.  The patient was taken to the operating room and placed in the supine position.  The patient was placed under general anesthesia.  Perioperative antibiotics were administered.  The patient was placed in dorsal lithotomy.  Patient was prepped and draped in a standard sterile fashion and a timeout was performed.  A 21 French rigid cystoscope was advanced into the urethra and into the bladder.  Complete cystoscopy was performed with findings noted above.  The left ureteral orifice was intubated with an open-ended ureteral catheter and a retrograde pyelogram was performed with no abnormal findings.  Same was performed on the right again with no abnormal findings.  I exchanged for a 51 French resectoscope which was inserted with a visual obturator in place.  I exchanged for the bipolar working element.  I resected the tumor of interest incident for specimen.  I fulgurated the resection  bed and surrounding area.  Total resection area was about 1 cm.  There was no evidence of any active bleeding.  There was no perforation.  I withdrew the scope and placed a Foley catheter.  Patient tolerated the procedure well was stable postoperatively.  In the PACU, I instilled gemcitabine into the bladder where it remained for proxy 1 hour prior to proper disposal.  Plan: Follow-up in 1 week for pathology review

## 2023-10-12 NOTE — Discharge Instructions (Signed)
Transurethral Resection of Bladder Tumor (TURBT) or Bladder Biopsy ° ° °Definition: ° Transurethral Resection of the Bladder Tumor is a surgical procedure used to diagnose and remove tumors within the bladder. TURBT is the most common treatment for early stage bladder cancer. ° °General instructions: °   ° Your recent bladder surgery requires very little post hospital care but some definite precautions. ° °Despite the fact that no skin incisions were used, the area around the bladder incisions are raw and covered with scabs to promote healing and prevent bleeding. Certain precautions are needed to insure that the scabs are not disturbed over the next 2-4 weeks while the healing proceeds. ° °Because the raw surface inside your bladder and the irritating effects of urine you may expect frequency of urination and/or urgency (a stronger desire to urinate) and perhaps even getting up at night more often. This will usually resolve or improve slowly over the healing period. You may see some blood in your urine over the first 6 weeks. Do not be alarmed, even if the urine was clear for a while. Get off your feet and drink lots of fluids until clearing occurs. If you start to pass clots or don't improve call us. ° °Diet: ° °You may return to your normal diet immediately. Because of the raw surface of your bladder, alcohol, spicy foods, foods high in acid and drinks with caffeine may cause irritation or frequency and should be used in moderation. To keep your urine flowing freely and avoid constipation, drink plenty of fluids during the day (8-10 glasses). Tip: Avoid cranberry juice because it is very acidic. ° °Activity: ° °Your physical activity doesn't need to be restricted. However, if you are very active, you may see some blood in the urine. We suggest that you reduce your activity under the circumstances until the bleeding has stopped. ° °Bowels: ° °It is important to keep your bowels regular during the postoperative  period. Straining with bowel movements can cause bleeding. A bowel movement every other day is reasonable. Use a mild laxative if needed, such as milk of magnesia 2-3 tablespoons, or 2 Dulcolax tablets. Call if you continue to have problems. If you had been taking narcotics for pain, before, during or after your surgery, you may be constipated. Take a laxative if necessary. ° ° ° °Medication: ° °You should resume your pre-surgery medications unless told not to. In addition you may be given an antibiotic to prevent or treat infection. Antibiotics are not always necessary. All medication should be taken as prescribed until the bottles are finished unless you are having an unusual reaction to one of the drugs. ° ° ° ° ° °

## 2023-10-12 NOTE — Anesthesia Procedure Notes (Signed)
Procedure Name: LMA Insertion Date/Time: 10/12/2023 12:04 PM  Performed by: Chinita Pester, CRNAPre-anesthesia Checklist: Patient identified, Emergency Drugs available, Suction available and Patient being monitored Patient Re-evaluated:Patient Re-evaluated prior to induction Oxygen Delivery Method: Circle System Utilized Preoxygenation: Pre-oxygenation with 100% oxygen Induction Type: IV induction Ventilation: Mask ventilation without difficulty LMA: LMA inserted LMA Size: 4.0 Number of attempts: 1 Airway Equipment and Method: Bite block Placement Confirmation: positive ETCO2 Tube secured with: Tape Dental Injury: Teeth and Oropharynx as per pre-operative assessment

## 2023-10-12 NOTE — Transfer of Care (Signed)
Immediate Anesthesia Transfer of Care Note  Patient: Carla Rojas  Procedure(s) Performed: TRANSURETHRAL RESECTION OF BLADDER TUMOR (TURBT) with GEMCITABINE BILATERAL RETROGRADE PYELOGRAM (Bilateral)  Patient Location: PACU  Anesthesia Type:General  Level of Consciousness: awake, drowsy, and patient cooperative  Airway & Oxygen Therapy: Patient Spontanous Breathing and Patient connected to face mask oxygen  Post-op Assessment: Report given to RN and Post -op Vital signs reviewed and stable  Post vital signs: Reviewed and stable  Last Vitals:  Vitals Value Taken Time  BP 116/79 10/12/23 1252  Temp    Pulse 74 10/12/23 1254  Resp 18 10/12/23 1254  SpO2 100 % 10/12/23 1254  Vitals shown include unfiled device data.  Last Pain:  Vitals:   10/12/23 1037  TempSrc: Oral  PainSc:          Complications: No notable events documented.

## 2023-10-12 NOTE — Anesthesia Preprocedure Evaluation (Signed)
Anesthesia Evaluation  Patient identified by MRN, date of birth, ID band Patient awake    Reviewed: Allergy & Precautions, NPO status , Patient's Chart, lab work & pertinent test results, reviewed documented beta blocker date and time   History of Anesthesia Complications Negative for: history of anesthetic complications  Airway Mallampati: III  TM Distance: >3 FB Neck ROM: Full    Dental no notable dental hx.    Pulmonary neg COPD, Not current smoker, neg PE   breath sounds clear to auscultation       Cardiovascular hypertension, (-) angina (-) CAD, (-) Past MI, (-) Cardiac Stents, (-) CABG, (-) CHF and (-) Orthopnea  Rhythm:Regular Rate:Normal     Neuro/Psych  Headaches, neg Seizures  Anxiety        GI/Hepatic ,GERD  ,,(+) neg Cirrhosis        Endo/Other  Hypothyroidism    Renal/GU Renal disease     Musculoskeletal   Abdominal   Peds  Hematology   Anesthesia Other Findings   Reproductive/Obstetrics                             Anesthesia Physical Anesthesia Plan  ASA: 2  Anesthesia Plan: General   Post-op Pain Management:    Induction: Intravenous  PONV Risk Score and Plan: 2 and Ondansetron  Airway Management Planned: Oral ETT  Additional Equipment:   Intra-op Plan:   Post-operative Plan: Extubation in OR  Informed Consent: I have reviewed the patients History and Physical, chart, labs and discussed the procedure including the risks, benefits and alternatives for the proposed anesthesia with the patient or authorized representative who has indicated his/her understanding and acceptance.     Dental advisory given  Plan Discussed with: CRNA  Anesthesia Plan Comments:        Anesthesia Quick Evaluation

## 2023-10-13 ENCOUNTER — Encounter (HOSPITAL_COMMUNITY): Payer: Self-pay | Admitting: Urology

## 2023-10-13 LAB — SURGICAL PATHOLOGY
